# Patient Record
Sex: Female | Born: 1970 | Race: White | Hispanic: No | Smoking: Never smoker
Health system: Southern US, Community
[De-identification: ages and names within clinical notes are randomized; demographics above are authoritative.]

## PROBLEM LIST (undated history)

## (undated) DIAGNOSIS — R238 Other skin changes: Secondary | ICD-10-CM

## (undated) DIAGNOSIS — R519 Headache, unspecified: Secondary | ICD-10-CM

## (undated) DIAGNOSIS — M1990 Unspecified osteoarthritis, unspecified site: Secondary | ICD-10-CM

## (undated) DIAGNOSIS — R531 Weakness: Secondary | ICD-10-CM

## (undated) DIAGNOSIS — R51 Headache: Secondary | ICD-10-CM

## (undated) DIAGNOSIS — K829 Disease of gallbladder, unspecified: Secondary | ICD-10-CM

## (undated) DIAGNOSIS — IMO0002 Reserved for concepts with insufficient information to code with codable children: Secondary | ICD-10-CM

## (undated) DIAGNOSIS — R112 Nausea with vomiting, unspecified: Secondary | ICD-10-CM

## (undated) DIAGNOSIS — E7800 Pure hypercholesterolemia, unspecified: Secondary | ICD-10-CM

## (undated) DIAGNOSIS — K219 Gastro-esophageal reflux disease without esophagitis: Secondary | ICD-10-CM

## (undated) DIAGNOSIS — G43909 Migraine, unspecified, not intractable, without status migrainosus: Secondary | ICD-10-CM

## (undated) DIAGNOSIS — N63 Unspecified lump in unspecified breast: Secondary | ICD-10-CM

## (undated) DIAGNOSIS — R233 Spontaneous ecchymoses: Secondary | ICD-10-CM

## (undated) DIAGNOSIS — R110 Nausea: Secondary | ICD-10-CM

## (undated) DIAGNOSIS — R140 Abdominal distension (gaseous): Secondary | ICD-10-CM

## (undated) DIAGNOSIS — Z9889 Other specified postprocedural states: Secondary | ICD-10-CM

## (undated) HISTORY — DX: Other skin changes: R23.8

## (undated) HISTORY — DX: Migraine, unspecified, not intractable, without status migrainosus: G43.909

## (undated) HISTORY — DX: Weakness: R53.1

## (undated) HISTORY — DX: Nausea: R11.0

## (undated) HISTORY — DX: Abdominal distension (gaseous): R14.0

## (undated) HISTORY — DX: Spontaneous ecchymoses: R23.3

## (undated) HISTORY — DX: Unspecified osteoarthritis, unspecified site: M19.90

## (undated) HISTORY — DX: Headache: R51

## (undated) HISTORY — DX: Unspecified lump in unspecified breast: N63.0

## (undated) HISTORY — DX: Headache, unspecified: R51.9

---
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
## 1997-12-29 ENCOUNTER — Other Ambulatory Visit: Admission: RE | Admit: 1997-12-29 | Discharge: 1997-12-29 | Payer: Self-pay | Admitting: Obstetrics and Gynecology

## 2000-12-09 ENCOUNTER — Inpatient Hospital Stay (HOSPITAL_COMMUNITY): Admission: AD | Admit: 2000-12-09 | Discharge: 2000-12-09 | Payer: Self-pay | Admitting: Obstetrics and Gynecology

## 2000-12-10 ENCOUNTER — Inpatient Hospital Stay (HOSPITAL_COMMUNITY): Admission: AD | Admit: 2000-12-10 | Discharge: 2000-12-10 | Payer: Self-pay | Admitting: Obstetrics and Gynecology

## 2001-01-01 ENCOUNTER — Encounter (INDEPENDENT_AMBULATORY_CARE_PROVIDER_SITE_OTHER): Payer: Self-pay

## 2001-01-01 ENCOUNTER — Inpatient Hospital Stay (HOSPITAL_COMMUNITY): Admission: AD | Admit: 2001-01-01 | Discharge: 2001-01-03 | Payer: Self-pay | Admitting: Obstetrics and Gynecology

## 2001-01-04 ENCOUNTER — Encounter: Admission: RE | Admit: 2001-01-04 | Discharge: 2001-02-03 | Payer: Self-pay | Admitting: Obstetrics and Gynecology

## 2001-01-10 ENCOUNTER — Encounter: Admission: RE | Admit: 2001-01-10 | Discharge: 2001-01-10 | Payer: Self-pay | Admitting: Obstetrics and Gynecology

## 2001-01-10 ENCOUNTER — Encounter: Payer: Self-pay | Admitting: Obstetrics and Gynecology

## 2001-03-06 ENCOUNTER — Encounter: Admission: RE | Admit: 2001-03-06 | Discharge: 2001-04-05 | Payer: Self-pay | Admitting: Obstetrics and Gynecology

## 2001-04-06 ENCOUNTER — Encounter: Admission: RE | Admit: 2001-04-06 | Discharge: 2001-05-06 | Payer: Self-pay | Admitting: Obstetrics and Gynecology

## 2001-06-06 ENCOUNTER — Encounter: Admission: RE | Admit: 2001-06-06 | Discharge: 2001-07-06 | Payer: Self-pay | Admitting: Obstetrics and Gynecology

## 2001-08-06 ENCOUNTER — Encounter: Admission: RE | Admit: 2001-08-06 | Discharge: 2001-09-05 | Payer: Self-pay | Admitting: Obstetrics and Gynecology

## 2001-09-06 ENCOUNTER — Encounter: Admission: RE | Admit: 2001-09-06 | Discharge: 2001-10-06 | Payer: Self-pay | Admitting: Obstetrics and Gynecology

## 2001-11-04 ENCOUNTER — Encounter: Admission: RE | Admit: 2001-11-04 | Discharge: 2001-12-04 | Payer: Self-pay | Admitting: Obstetrics and Gynecology

## 2002-05-16 ENCOUNTER — Emergency Department (HOSPITAL_COMMUNITY): Admission: EM | Admit: 2002-05-16 | Discharge: 2002-05-17 | Payer: Self-pay | Admitting: Emergency Medicine

## 2002-05-17 ENCOUNTER — Encounter: Payer: Self-pay | Admitting: Emergency Medicine

## 2002-07-21 ENCOUNTER — Other Ambulatory Visit: Admission: RE | Admit: 2002-07-21 | Discharge: 2002-07-21 | Payer: Self-pay | Admitting: Obstetrics and Gynecology

## 2002-09-03 ENCOUNTER — Ambulatory Visit (HOSPITAL_COMMUNITY): Admission: RE | Admit: 2002-09-03 | Discharge: 2002-09-03 | Payer: Self-pay | Admitting: Obstetrics and Gynecology

## 2004-02-29 ENCOUNTER — Other Ambulatory Visit: Admission: RE | Admit: 2004-02-29 | Discharge: 2004-02-29 | Payer: Self-pay | Admitting: Obstetrics and Gynecology

## 2004-08-25 ENCOUNTER — Encounter (INDEPENDENT_AMBULATORY_CARE_PROVIDER_SITE_OTHER): Payer: Self-pay | Admitting: *Deleted

## 2004-08-25 ENCOUNTER — Ambulatory Visit (HOSPITAL_COMMUNITY): Admission: RE | Admit: 2004-08-25 | Discharge: 2004-08-25 | Payer: Self-pay | Admitting: Obstetrics and Gynecology

## 2007-08-07 HISTORY — PX: ABDOMINAL HYSTERECTOMY: SHX81

## 2007-08-29 ENCOUNTER — Ambulatory Visit (HOSPITAL_COMMUNITY): Admission: RE | Admit: 2007-08-29 | Discharge: 2007-08-29 | Payer: Self-pay | Admitting: Internal Medicine

## 2007-08-29 ENCOUNTER — Ambulatory Visit: Payer: Self-pay | Admitting: Vascular Surgery

## 2009-09-17 ENCOUNTER — Emergency Department (HOSPITAL_COMMUNITY): Admission: EM | Admit: 2009-09-17 | Discharge: 2009-09-17 | Payer: Self-pay | Admitting: Family Medicine

## 2010-10-26 LAB — POCT URINALYSIS DIP (DEVICE)
Bilirubin Urine: NEGATIVE
Glucose, UA: NEGATIVE mg/dL
Hgb urine dipstick: NEGATIVE
Ketones, ur: NEGATIVE mg/dL
Nitrite: NEGATIVE
Protein, ur: NEGATIVE mg/dL
Specific Gravity, Urine: 1.02 (ref 1.005–1.030)
Urobilinogen, UA: 0.2 mg/dL (ref 0.0–1.0)
pH: 5 (ref 5.0–8.0)

## 2010-10-26 LAB — POCT PREGNANCY, URINE: Preg Test, Ur: NEGATIVE

## 2010-12-22 NOTE — Op Note (Signed)
NAMEMERRIT, WAUGH              ACCOUNT NO.:  [id number]   MEDICAL RECORD NO.:  [id number]          PATIENT TYPE:  AMB   LOCATION:  SDC                           FACILITY:  [facility]   PHYSICIAN:  Dineen Kid. Rana Snare, M.D.    DATE OF BIRTH:  09/15/1970   DATE OF PROCEDURE:  08/25/2004  DATE OF DISCHARGE:                                 OPERATIVE REPORT   PREOPERATIVE DIAGNOSES:  Abnormal uterine bleeding and endometrial mass on  sonohysterogram.   POSTOPERATIVE DIAGNOSES:  Abnormal uterine bleeding and endometrial mass on  sonohysterogram.  Endometrial polyps.   PROCEDURE:  Hysteroscopy D&C with polypectomy.   SURGEON:  Dineen Kid. Rana Snare, M.D.   ANESTHESIA:  General by LMA and also paracervical block.   INDICATIONS FOR PROCEDURE:  Ms. Kerney Elbe is a 40 year old status post tubal  ligation who presented to me in August with abnormal uterine bleeding not  controlled with conservative measures. She underwent a sonohysterogram  showing two small endometrial polyps on the anterior surface. She continued  to have menometrorrhagia which has not been controlled with birth control  pills, a moderate amount of dysmenorrhea requiring antiinflammatory  medications as well as Vicodin for pain relief. She presents today for  surgical evaluation and removal of these polyps.  Planned hysteroscopy, D&C  with polypectomy.  The risks and benefits were discussed and informed  consent was obtained.   FINDINGS:  Endometrial polyps on the anterior wall otherwise normal  appearing endometrium, normal appearing ostia, normal appearing cervix.   DESCRIPTION OF PROCEDURE:  After adequate analgesia, the patient was placed  in the dorsal lithotomy position, she was sterilely prepped and draped, the  bladder was sterilely drained. A Graves speculum was placed, tenaculum was  placed on the anterior lip of the cervix, paracervical block was placed with  1% Xylocaine 1:100,000 epinephrine, 20 mL total used. The uterus  sounded to  8 cm, easily dilated to a #27 Pratt dilator. The hysteroscope was inserted  and the above findings were noted.  Curettage performed retrieving small  fragments of endometrial polyp on the anterior surface. This was performed  until a gritty surface was felt throughout the endometrial cavity.  The  hysteroscope was reinserted, the polyps were completely removed and no  remaining pathology was identified.  The hysteroscope was then removed,  tenaculum removed from the cervix, the cervix found to be hemostatic. The  patient was then transferred to the recovery room in stable condition.  She  received Toradol 30 mg IV.  Sorbitol deficit was 30 mL.   DISPOSITION:  The patient will be discharged home and will followup in the  office in 2-3 weeks.  She was sent home with a routine instruction sheet for  D&C and told to return for increased pain, fever or bleeding.      DCL/MEDQ  D:  08/25/2004  T:  08/25/2004  Job:  [id number]

## 2010-12-22 NOTE — H&P (Signed)
NAME:  Kathleen Carson, Kathleen Carson                        ACCOUNT NO.:  [id number]   MEDICAL RECORD NO.:  [id number]                   PATIENT TYPE:  AMB   LOCATION:  SDC                                  FACILITY:  [facility]   PHYSICIAN:  Dineen Kid. Rana Snare, M.D.                 DATE OF BIRTH:  1971/05/21   DATE OF ADMISSION:  09/03/2002  DATE OF DISCHARGE:                                HISTORY & PHYSICAL   HISTORY OF PRESENT ILLNESS:  The patient is a 40 year old G5 P4 A1 with  desire of sterility.  She also has been having problems with bilateral  pelvic pain, dyspareunia with a predilection towards the left side, and also  generalized dysmenorrhea.  She has been on oral contraceptive agents without  improvement of the symptoms and also on antiinflammatory medications.  She  desires definitive surgical evaluation and treatment for this.  She also  presents for laparoscopic bilateral tubal ligation.   PAST MEDICAL HISTORY:  Negative.   SOCIAL HISTORY:  She had a D&C in 1989, tonsillectomy at age [age], hernia  repair on the right side at age [age].  She has had three vaginal deliveries  and one miscarriage.   MEDICATIONS:  Currently on Alesse.   ALLERGIES:  CODEINE.   PHYSICAL EXAMINATION:  VITAL SIGNS:  Blood pressure 118/62.  HEART:  Regular rate and rhythm.  LUNGS:  Clear to auscultation bilaterally.  ABDOMEN:  Nondistended, nontender.  PELVIC:  Uterus is anteverted, mobile, nontender, with no adnexal masses  palpable.  No uterosacral nodularity is noted.   IMPRESSION AND PLAN:  1. Desired sterility.  The patient desires bilateral tubal ligation.  Risks     and benefits are discussed at length including 12/998 failure rate.  She     does give her informed consent.  2. Dysmenorrhea, pelvic pain, and dyspareunia.  Plan laparoscopic evaluation     of the pelvis for possible endometriosis, adhesions, or causes of the     pelvic pain; possible ablation of endometriosis or lysis of adhesions.    Risks and benefits again were discussed at length.  Informed consent was     obtained which includes but not limited to risk of infection; bleeding;     damage to uterus, tubes, ovaries, bowel, or bladder; possibility of not     being able to alleviate the pelvic pain or recurrence of the pelvic pain;     also risks associated with general anesthesia and also blood transfusion.     She gives her informed consent.                                               Dineen Kid Rana Snare, M.D.    DCL/MEDQ  D:  09/02/2002  T:  09/02/2002  Job:  [id number]  

## 2010-12-22 NOTE — Op Note (Signed)
NAME:  QUANTIA, GRULLON                        ACCOUNT NO.:  [id number]   MEDICAL RECORD NO.:  [id number]                   PATIENT TYPE:  AMB   LOCATION:  SDC                                  FACILITY:  [facility]   PHYSICIAN:  Dineen Kid. Rana Snare, M.D.                 DATE OF BIRTH:  11/10/1970   DATE OF PROCEDURE:  09/03/2002  DATE OF DISCHARGE:                                 OPERATIVE REPORT   PREOPERATIVE DIAGNOSES:  1. Dysmenorrhea.  2. Pelvic pain.  3. Dyspareunia.  4. Desired sterility.   POSTOPERATIVE DIAGNOSES:  1. Dysmenorrhea.  2. Pelvic pain.  3. Dyspareunia.  4. Desired sterility.  5. Pelvic congestion syndrome.   PROCEDURE:  Laparoscopy with bilateral tubal ligation by fulguration.   SURGEON:  Dineen Kid. Rana Snare, M.D.   ANESTHESIA:  General endotracheal.   INDICATIONS:  The patient is a 40 year old G5, P4, A1, with desired  sterility.  She has been having a lot of problems with bilateral pelvic  pain, dyspareunia, and generalized dysmenorrhea.  She has been on oral  contraceptive agents and anti-inflammatory medications without much benefit.  She desires definitive surgical evaluation and treatment for this and also  bilateral tubal ligation.  The risks and benefits were discussed at length,  including five out of 1000 tubal failure.  She does give her informed  consent.  See history and physical for further details.   FINDINGS:  At the time of surgery, the appendix was retrocecal but a normal-  appearing liver.  The uterus, tubes, and ovaries were grossly normal in  appearance; however, from the broad ligament down to the uterosacral  ligaments there were markedly enlarged veins consistent with pelvic  congestion syndrome.  No evidence of endometriosis was seen, and the cul-de-  sac was also clear.   DESCRIPTION OF PROCEDURE:  After adequate analgesia, the patient was placed  in the dorsal lithotomy position.  She was sterilely prepped and draped.  The bladder was  sterilely drained.  A Hulka tenaculum was placed into the  cervix.  A 1 cm infraumbilical skin incision was made, a Veress needle was  inserted, the abdomen was insufflated with dullness to percussion.  A 5 mm  trocar was inserted to the left of the midline two fingerbreadths above the  pubic symphysis.  The above findings were then noted.  After careful  examination of the abdomen and pelvis revealing only signs of pelvic  congestion syndrome and no identifiable adhesions or endometriosis, tubal  ligation was carried out by identifying the right fallopian tube.  The  midportion of the tube was grasped, was cauterized with bipolar  cauterization over an approximately 3 cm section of the tube with loss of  resistance by the Ohm meter and a good thermal burn noted throughout the  entirety of the tube.  The left fallopian tube was identified in a similar  fashion.  The midportion  of the tube was grasped, cauterized over a 3 cm  section of the tube with loss of resistance by the Ohm meter and a good  thermal burn noted across the entirety of the tube.  At this time the  abdomen was desufflated, the trocars were removed.  The infraumbilical skin  incision was closed with 0 Vicryl in the fascia, 3-0 Vicryl Rapide  subcuticular stitch on the skin.  The 5 mm trocar site was closed with 3-0  Vicryl Rapide subcuticular stitch.  The incisions were injected with 0.25%  Marcaine, 10 mL used, and the tenaculum was removed from the cervix and  noted to be hemostatic.  The patient tolerated the procedure well, was  stable on transfer to the recovery room.  The sponge and instrument count  was normal x3.  Estimated blood loss less than 10 mL.   DISPOSITION:  The patient will be discharged home and will follow up in the  office in two to three weeks.  Sent home with a routine instruction sheet  for laparoscopy.  She already has a prescription for Vicodin and ibuprofen  at home.  Told to return for increased  fever, pain, or bleeding.                                               Dineen Kid Rana Snare, M.D.    DCL/MEDQ  D:  09/03/2002  T:  09/03/2002  Job:  [id number]

## 2011-03-14 ENCOUNTER — Other Ambulatory Visit: Payer: Self-pay | Admitting: Family Medicine

## 2011-03-14 DIAGNOSIS — N6320 Unspecified lump in the left breast, unspecified quadrant: Secondary | ICD-10-CM

## 2011-03-15 ENCOUNTER — Inpatient Hospital Stay (INDEPENDENT_AMBULATORY_CARE_PROVIDER_SITE_OTHER)
Admission: RE | Admit: 2011-03-15 | Discharge: 2011-03-15 | Disposition: A | Discharge status: Home / Self Care | Payer: Self-pay | Source: Ambulatory Visit | Attending: Emergency Medicine | Admitting: Emergency Medicine

## 2011-03-15 DIAGNOSIS — J019 Acute sinusitis, unspecified: Secondary | ICD-10-CM

## 2011-03-15 DIAGNOSIS — R1032 Left lower quadrant pain: Secondary | ICD-10-CM

## 2011-03-15 DIAGNOSIS — H9209 Otalgia, unspecified ear: Secondary | ICD-10-CM

## 2011-03-20 ENCOUNTER — Ambulatory Visit
Admission: RE | Admit: 2011-03-20 | Discharge: 2011-03-20 | Disposition: A | Discharge status: Home / Self Care | Payer: Self-pay | Source: Ambulatory Visit | Attending: Family Medicine | Admitting: Family Medicine

## 2011-03-20 ENCOUNTER — Other Ambulatory Visit: Payer: Self-pay | Admitting: Family Medicine

## 2011-03-20 DIAGNOSIS — Z1231 Encounter for screening mammogram for malignant neoplasm of breast: Secondary | ICD-10-CM

## 2011-03-20 DIAGNOSIS — N6320 Unspecified lump in the left breast, unspecified quadrant: Secondary | ICD-10-CM

## 2011-03-22 ENCOUNTER — Other Ambulatory Visit: Payer: Self-pay | Admitting: Family Medicine

## 2011-03-22 DIAGNOSIS — R928 Other abnormal and inconclusive findings on diagnostic imaging of breast: Secondary | ICD-10-CM

## 2011-03-29 ENCOUNTER — Ambulatory Visit
Admission: RE | Admit: 2011-03-29 | Discharge: 2011-03-29 | Disposition: A | Discharge status: Home / Self Care | Payer: Self-pay | Source: Ambulatory Visit | Attending: Family Medicine | Admitting: Family Medicine

## 2011-03-29 DIAGNOSIS — R928 Other abnormal and inconclusive findings on diagnostic imaging of breast: Secondary | ICD-10-CM

## 2011-06-25 ENCOUNTER — Encounter: Payer: Self-pay | Admitting: *Deleted

## 2011-06-25 ENCOUNTER — Other Ambulatory Visit: Payer: Self-pay

## 2011-06-25 ENCOUNTER — Emergency Department (HOSPITAL_COMMUNITY)
Admission: EM | Admit: 2011-06-25 | Discharge: 2011-06-25 | Discharge status: Against Medical Advice | Payer: Medicaid Other | Attending: Emergency Medicine | Admitting: Emergency Medicine

## 2011-06-25 DIAGNOSIS — R1013 Epigastric pain: Secondary | ICD-10-CM | POA: Insufficient documentation

## 2011-06-25 NOTE — ED Notes (Signed)
The pt has epigastric pain since Sunday night intermittently.  She describes it as a pressure and getting worse.

## 2011-07-21 ENCOUNTER — Emergency Department (HOSPITAL_COMMUNITY)
Admission: EM | Admit: 2011-07-21 | Discharge: 2011-07-21 | Disposition: A | Discharge status: Home / Self Care | Payer: Medicaid Other | Attending: Emergency Medicine | Admitting: Emergency Medicine

## 2011-07-21 ENCOUNTER — Encounter (HOSPITAL_COMMUNITY): Payer: Self-pay | Admitting: *Deleted

## 2011-07-21 ENCOUNTER — Emergency Department (HOSPITAL_COMMUNITY): Payer: Medicaid Other

## 2011-07-21 DIAGNOSIS — K819 Cholecystitis, unspecified: Secondary | ICD-10-CM | POA: Insufficient documentation

## 2011-07-21 DIAGNOSIS — R10819 Abdominal tenderness, unspecified site: Secondary | ICD-10-CM | POA: Insufficient documentation

## 2011-07-21 DIAGNOSIS — K7689 Other specified diseases of liver: Secondary | ICD-10-CM | POA: Insufficient documentation

## 2011-07-21 DIAGNOSIS — R109 Unspecified abdominal pain: Secondary | ICD-10-CM | POA: Insufficient documentation

## 2011-07-21 HISTORY — DX: Reserved for concepts with insufficient information to code with codable children: IMO0002

## 2011-07-21 LAB — CBC
HCT: 41.8 % (ref 36.0–46.0)
Hemoglobin: 14.4 g/dL (ref 12.0–15.0)
RBC: 4.65 MIL/uL (ref 3.87–5.11)
WBC: 6.2 10*3/uL (ref 4.0–10.5)

## 2011-07-21 LAB — DIFFERENTIAL
Lymphocytes Relative: 27 % (ref 12–46)
Lymphs Abs: 1.7 10*3/uL (ref 0.7–4.0)
Monocytes Absolute: 0.6 10*3/uL (ref 0.1–1.0)
Monocytes Relative: 10 % (ref 3–12)
Neutro Abs: 3.8 10*3/uL (ref 1.7–7.7)

## 2011-07-21 LAB — COMPREHENSIVE METABOLIC PANEL
Alkaline Phosphatase: 276 U/L — ABNORMAL HIGH (ref 39–117)
BUN: 10 mg/dL (ref 6–23)
CO2: 24 mEq/L (ref 19–32)
Chloride: 102 mEq/L (ref 96–112)
Creatinine, Ser: 0.88 mg/dL (ref 0.50–1.10)
GFR calc non Af Amer: 81 mL/min — ABNORMAL LOW (ref >90)
Total Bilirubin: 0.8 mg/dL (ref 0.3–1.2)

## 2011-07-21 LAB — LIPASE, BLOOD: Lipase: 21 U/L (ref 11–59)

## 2011-07-21 MED ORDER — SODIUM CHLORIDE 0.9 % IV SOLN
INTRAVENOUS | Status: DC
  Administered 2011-07-21: 09:00:00 via INTRAVENOUS

## 2011-07-21 MED ORDER — ONDANSETRON 8 MG PO TBDP: 8.0000 mg | ORAL_TABLET | Freq: Three times a day (TID) | ORAL | Status: AC | PRN

## 2011-07-21 MED ORDER — FENTANYL CITRATE 0.05 MG/ML IJ SOLN
50.0000 ug | Freq: Once | INTRAMUSCULAR | Status: AC
  Administered 2011-07-21: 50 ug via INTRAVENOUS
  Filled 2011-07-21: qty 2

## 2011-07-21 MED ORDER — OXYCODONE-ACETAMINOPHEN 5-325 MG PO TABS: 1.0000 | ORAL_TABLET | Freq: Four times a day (QID) | ORAL | Status: AC | PRN

## 2011-07-21 MED ORDER — ONDANSETRON HCL 4 MG/2ML IJ SOLN
4.0000 mg | Freq: Once | INTRAMUSCULAR | Status: AC
  Administered 2011-07-21: 4 mg via INTRAVENOUS
  Filled 2011-07-21: qty 2

## 2011-07-21 NOTE — ED Provider Notes (Signed)
History     CSN: [id number] Arrival date & time: 07/21/2011  8:21 AM   First MD Initiated Contact with Patient 07/21/11 [phone]      Chief Complaint  Patient presents with  . Abdominal Pain    epigastric    (Consider location/radiation/quality/duration/timing/severity/associated sxs/prior treatment) Patient is a 40 y.o. female presenting with abdominal pain. The history is provided by the patient.  Abdominal Pain The primary symptoms of the illness include abdominal pain and nausea. The primary symptoms of the illness do not include vomiting.  Symptoms associated with the illness do not include chills or back pain.  patient has had episodes of epigastric abdominal pain for the last month. It is worse after eating. She woke with the severe pain this morning. His been constant. She's had nauseousness with the episode. No vomiting. No diarrhea. She's had a previous hysterectomy. Nothing makes the pain better. No fevers.  Past Medical History  Diagnosis Date  . Ulcer     Past Surgical History  Procedure Date  . Tonsillectomy   . Abdominal hysterectomy     No family history on file.  History  Substance Use Topics  . Smoking status: Never Smoker   . Smokeless tobacco: Not on file  . Alcohol Use: No    OB History    Grav Para Term Preterm Abortions TAB SAB Ect Mult Living                  Review of Systems  Constitutional: Negative for chills.  Respiratory: Negative for chest tightness.   Cardiovascular: Negative for chest pain.  Gastrointestinal: Positive for nausea and abdominal pain. Negative for vomiting.  Genitourinary: Negative for pelvic pain.  Musculoskeletal: Negative for back pain.  Neurological: Negative for headaches.  Psychiatric/Behavioral: Negative for confusion.    Allergies  Codeine  Home Medications   Current Outpatient Rx  Name Route Sig Dispense Refill  . ESTRADIOL 2 MG PO TABS Oral Take 2 mg by mouth daily.      Marland Kitchen FLUOXETINE HCL 20 MG PO  CAPS Oral Take 20 mg by mouth daily.      Marland Kitchen PROGESTERONE MICRONIZED 100 MG PO CAPS Oral Take 100 mg by mouth daily.      Marland Kitchen ONDANSETRON 8 MG PO TBDP Oral Take 1 tablet (8 mg total) by mouth every 8 (eight) hours as needed for nausea. 20 tablet 0  . OXYCODONE-ACETAMINOPHEN 5-325 MG PO TABS Oral Take 1-2 tablets by mouth every 6 (six) hours as needed for pain. 20 tablet 0    BP 122/86  Pulse 90  Temp(Src) 98 F (36.7 C) (Oral)  Resp 18  SpO2 96%  Physical Exam  Nursing note and vitals reviewed. Constitutional: She is oriented to person, place, and time. She appears well-developed and well-nourished.  HENT:  Head: Normocephalic and atraumatic.  Eyes: EOM are normal. Pupils are equal, round, and reactive to light.  Neck: Normal range of motion. Neck supple.  Cardiovascular: Normal rate, regular rhythm and normal heart sounds.   No murmur heard. Pulmonary/Chest: Effort normal and breath sounds normal. No respiratory distress. She has no wheezes. She has no rales.  Abdominal: Soft. Bowel sounds are normal. She exhibits no distension. There is tenderness. There is no rebound and no guarding.       Moderate tenderness to epigastric and right upper quadrant.  Musculoskeletal: Normal range of motion.  Neurological: She is alert and oriented to person, place, and time. No cranial nerve deficit.  Skin: Skin  is warm and dry.  Psychiatric: She has a normal mood and affect. Her speech is normal.    ED Course  Procedures (including critical care time)  Labs Reviewed  COMPREHENSIVE METABOLIC PANEL - Abnormal; Notable for the following:    Sodium 134 (*)    Glucose, Bld 103 (*)    AST 500 (*)    ALT 312 (*)    Alkaline Phosphatase 276 (*)    GFR calc non Af Amer 81 (*)    All other components within normal limits  CBC  DIFFERENTIAL  LIPASE, BLOOD   US Abdomen Complete  07/21/2011  *RADIOLOGY REPORT*  Clinical Data:  Epigastric pain.  ABDOMINAL ULTRASOUND COMPLETE  Comparison:  None.   Findings:  Gallbladder:  Multiple small shadowing calculi are identified within the dependent portion of the gallbladder.  The gallbladder wall is increased in thickness measuring 4.7 mm.  Positive sonographic Murphy's sign.  Common Bile Duct:  Within normal limits in caliber.  Liver: The liver parenchyma has a slightly coarsened echotexture. There is no mass identified.  IVC:  Appears normal.  Pancreas:  No abnormality identified.  Spleen:  Within normal limits in size and echotexture.  Right kidney:  Normal in size and parenchymal echogenicity.  No evidence of mass or hydronephrosis.  Left kidney:  Normal in size and parenchymal echogenicity.  No evidence of mass or hydronephrosis.  Abdominal Aorta:  No aneurysm identified.  IMPRESSION:  1.  Gallstones and gallbladder wall thickening.  There is also a positive sonographic Murphy's sign.  Cannot rule out acute cholecystitis. 2.  Fatty infiltration of the liver.  Original Report Authenticated By: Rosealee Albee, M.D.     1. Cholecystitis       MDM  Patient with right upper quadrant abdominal pain. Moderate severity. LFTs are somewhat elevated. The liver limits not elevated. Her white count is not elevated. She's not had fevers. Her ultrasound showed cholecystitis. Patient is not wanted the surgery done today if possible. Discussed the case with Dr. Daphine Deutscher  From general surgery. He recommended pain medicines and clear liquid diet. He said without cholangitis should not harm her to followup for an outpatient surgery. He states to call the office on Monday. She will be discharged.        Juliet Rude. Rubin Payor, MD 07/21/11 1120

## 2011-07-21 NOTE — ED Notes (Signed)
Patient given discharge instructions, information, prescriptions, and diet order. Patient states that they adequately understand discharge information given and to return to ED if symptoms return or worsen.     

## 2011-07-21 NOTE — ED Notes (Signed)
Epigastric pain worsening over last few weeks, more frequent episodes, some nausea

## 2011-07-29 ENCOUNTER — Emergency Department (HOSPITAL_COMMUNITY)
Admission: EM | Admit: 2011-07-29 | Discharge: 2011-07-29 | Disposition: A | Discharge status: Home / Self Care | Payer: Medicaid Other | Attending: Emergency Medicine | Admitting: Emergency Medicine

## 2011-07-29 ENCOUNTER — Encounter (HOSPITAL_COMMUNITY): Payer: Self-pay | Admitting: *Deleted

## 2011-07-29 DIAGNOSIS — K819 Cholecystitis, unspecified: Secondary | ICD-10-CM

## 2011-07-29 HISTORY — DX: Disease of gallbladder, unspecified: K82.9

## 2011-07-29 LAB — DIFFERENTIAL
Basophils Absolute: 0 10*3/uL (ref 0.0–0.1)
Basophils Relative: 0 % (ref 0–1)
Eosinophils Relative: 2 % (ref 0–5)
Lymphocytes Relative: 30 % (ref 12–46)
Neutro Abs: 4.7 10*3/uL (ref 1.7–7.7)

## 2011-07-29 LAB — URINALYSIS, ROUTINE W REFLEX MICROSCOPIC
Glucose, UA: NEGATIVE mg/dL
Hgb urine dipstick: NEGATIVE
Leukocytes, UA: NEGATIVE
Specific Gravity, Urine: 1.044 — ABNORMAL HIGH (ref 1.005–1.030)
pH: 5.5 (ref 5.0–8.0)

## 2011-07-29 LAB — CBC
MCHC: 35.1 g/dL (ref 30.0–36.0)
MCV: 88.9 fL (ref 78.0–100.0)
Platelets: 277 10*3/uL (ref 150–400)
RDW: 11.9 % (ref 11.5–15.5)
WBC: 8.3 10*3/uL (ref 4.0–10.5)

## 2011-07-29 LAB — POCT I-STAT, CHEM 8
BUN: 20 mg/dL (ref 6–23)
Chloride: 105 mEq/L (ref 96–112)
Creatinine, Ser: 1.1 mg/dL (ref 0.50–1.10)
Potassium: 4.2 mEq/L (ref 3.5–5.1)
Sodium: 140 mEq/L (ref 135–145)

## 2011-07-29 LAB — COMPREHENSIVE METABOLIC PANEL
ALT: 78 U/L — ABNORMAL HIGH (ref 0–35)
AST: 68 U/L — ABNORMAL HIGH (ref 0–37)
Albumin: 3.6 g/dL (ref 3.5–5.2)
CO2: 27 mEq/L (ref 19–32)
Calcium: 9.2 mg/dL (ref 8.4–10.5)
Creatinine, Ser: 0.99 mg/dL (ref 0.50–1.10)
GFR calc non Af Amer: 70 mL/min — ABNORMAL LOW (ref >90)
Sodium: 137 mEq/L (ref 135–145)

## 2011-07-29 MED ORDER — SODIUM CHLORIDE 0.9 % IV SOLN: 20.0000 mL | INTRAVENOUS | Status: DC

## 2011-07-29 MED ORDER — FENTANYL CITRATE 0.05 MG/ML IJ SOLN
50.0000 ug | Freq: Once | INTRAMUSCULAR | Status: DC
  Filled 2011-07-29: qty 2

## 2011-07-29 MED ORDER — ONDANSETRON HCL 4 MG/2ML IJ SOLN
4.0000 mg | Freq: Once | INTRAMUSCULAR | Status: DC
  Filled 2011-07-29: qty 2

## 2011-07-29 NOTE — ED Notes (Signed)
Pa notified of elopement

## 2011-07-29 NOTE — ED Notes (Signed)
Pt c/o epigastric pain radiating to chest and back. Pt recently dx'd w/ cholecystitis. Pt c/o nausea, denies vomiting. Pt last took pain meds at 2330. Last nausea med at 2230.

## 2011-07-29 NOTE — ED Notes (Signed)
Pa seen and reassesed pt, felt refused pain medication and verbalized that she's feeling better and ready for discharge as per pa, pt refused to wait for discharge paper, walked out with spouse

## 2011-07-31 NOTE — ED Provider Notes (Signed)
History     CSN: [id number]  Arrival date & time 07/29/11  0133   First MD Initiated Contact with Patient 07/29/11 [phone]      Chief Complaint  Patient presents with  . Abdominal Pain    recently diagnosed w/ gallstones    (Consider location/radiation/quality/duration/timing/severity/associated sxs/prior treatment) HPI Comments: Patient presents today with RUQ and epigastric pain.  She was seen in the ED for similar symptoms 7 days ago and diagnosed with Acute Cholecystitis.  At that time she opted to not have surgery and wait to have surgery with her surgeon the first week of January.  She reports that since arriving in the ED her pain has improved significantly.  She denies vomiting and denies a fever.  She reports that she has been eating low fat foods.    Patient is a 40 y.o. female presenting with abdominal pain. The history is provided by the patient and the spouse.  Abdominal Pain The primary symptoms of the illness include abdominal pain and nausea. The primary symptoms of the illness do not include fever, fatigue, shortness of breath, vomiting, diarrhea, hematemesis, hematochezia or dysuria. The problem has been gradually worsening.  The patient states that she believes she is currently not pregnant. The patient has not had a change in bowel habit. Symptoms associated with the illness do not include chills, diaphoresis, heartburn, constipation, urgency, hematuria, frequency or back pain. Significant associated medical issues include gallstones.    Past Medical History  Diagnosis Date  . Ulcer   . Gallbladder attack     Past Surgical History  Procedure Date  . Tonsillectomy   . Abdominal hysterectomy   . Hernia repair     History reviewed. No pertinent family history.  History  Substance Use Topics  . Smoking status: Never Smoker   . Smokeless tobacco: Not on file  . Alcohol Use: No    OB History    Grav Para Term Preterm Abortions TAB SAB Ect Mult Living             Review of Systems  Constitutional: Negative for fever, chills, diaphoresis and fatigue.  Respiratory: Negative for chest tightness, shortness of breath and wheezing.   Cardiovascular: Negative for chest pain.  Gastrointestinal: Positive for nausea and abdominal pain. Negative for heartburn, vomiting, diarrhea, constipation, blood in stool, hematochezia and hematemesis.  Genitourinary: Negative for dysuria, urgency, frequency and hematuria.  Musculoskeletal: Negative for back pain.  Neurological: Negative for dizziness, syncope and light-headedness.    Allergies  Codeine  Home Medications   Current Outpatient Rx  Name Route Sig Dispense Refill  . ESTRADIOL 2 MG PO TABS Oral Take 2 mg by mouth daily.      Marland Kitchen FLUOXETINE HCL 20 MG PO CAPS Oral Take 20 mg by mouth daily.      . OXYCODONE-ACETAMINOPHEN 5-325 MG PO TABS Oral Take 1-2 tablets by mouth every 6 (six) hours as needed for pain. 20 tablet 0  . PROGESTERONE MICRONIZED 100 MG PO CAPS Oral Take 100 mg by mouth daily.        BP 131/92  Pulse 89  Temp(Src) 97.8 F (36.6 C) (Oral)  Resp 18  SpO2 100%  Physical Exam  Nursing note and vitals reviewed. Constitutional: She is oriented to person, place, and time. She appears well-developed and well-nourished. No distress.  HENT:  Head: Normocephalic and atraumatic.  Neck: Normal range of motion. Neck supple.  Cardiovascular: Normal rate and normal heart sounds.   Pulmonary/Chest: Effort normal and  breath sounds normal.  Abdominal: Soft. Bowel sounds are normal.  Musculoskeletal: Normal range of motion.  Neurological: She is alert and oriented to person, place, and time.  Skin: Skin is warm and dry. No rash noted. She is not diaphoretic.  Psychiatric: She has a normal mood and affect.    ED Course  Procedures (including critical care time)  Labs Reviewed  COMPREHENSIVE METABOLIC PANEL - Abnormal; Notable for the following:    Glucose, Bld 109 (*)    AST 68 (*)     ALT 78 (*)    Alkaline Phosphatase 161 (*)    GFR calc non Af Amer 70 (*)    GFR calc Af Amer 82 (*)    All other components within normal limits  URINALYSIS, ROUTINE W REFLEX MICROSCOPIC - Abnormal; Notable for the following:    Color, Urine AMBER (*) BIOCHEMICALS MAY BE AFFECTED BY COLOR   APPearance CLOUDY (*)    Specific Gravity, Urine 1.044 (*)    Bilirubin Urine SMALL (*)    All other components within normal limits  POCT I-STAT, CHEM 8 - Abnormal; Notable for the following:    Glucose, Bld 109 (*)    All other components within normal limits  CBC  DIFFERENTIAL  LIPASE, BLOOD  LAB REPORT - SCANNED   No results found.   No diagnosis found.  Discussed results of the labs with patient.  IV pain medication had been ordered for patient, but patient denied.  She reported that the pain had improved on its own.  She also stated that she did not want to meet with the surgeon today and that she would see the surgeon in January as previously scheduled.  Patient then left the ED AMA prior to me discharging her.  MDM  Patient with diagnosis of cholecystitis 7 days ago.  She reports that her pain had improved while in the waiting area of the ED.  Patient is afebrile, not vomiting, and able to tolerate po liquids.  Patient has an appointment scheduled for surgery in July.        Pascal Lux [facility] 08/01/11 1304

## 2011-08-01 NOTE — ED Provider Notes (Signed)
Medical screening examination/treatment/procedure(s) were performed by non-physician practitioner and as supervising physician I was immediately available for consultation/collaboration.  Ethelda Chick, MD 08/01/11 [phone]

## 2011-08-15 ENCOUNTER — Telehealth (INDEPENDENT_AMBULATORY_CARE_PROVIDER_SITE_OTHER): Payer: Self-pay

## 2011-08-15 NOTE — Telephone Encounter (Signed)
Pt called c/o aches all over her body,feeling lithargic. Pt states she is having ruq abd pain also.  Pt concerned it may be her gallbladder and not the flu. Pt directed to the ER to be accessed to r/o inflammed gallbladder vs flu. Pt states she will go to ER now.

## 2011-08-17 ENCOUNTER — Other Ambulatory Visit (INDEPENDENT_AMBULATORY_CARE_PROVIDER_SITE_OTHER): Payer: Self-pay | Admitting: Surgery

## 2011-08-17 ENCOUNTER — Encounter (INDEPENDENT_AMBULATORY_CARE_PROVIDER_SITE_OTHER): Payer: Self-pay | Admitting: Surgery

## 2011-08-17 ENCOUNTER — Ambulatory Visit (INDEPENDENT_AMBULATORY_CARE_PROVIDER_SITE_OTHER): Payer: Medicaid Other | Admitting: Surgery

## 2011-08-17 VITALS — BP 116/68 | HR 68 | Temp 97.8°F | Resp 18 | Ht 65.0 in | Wt 203.4 lb

## 2011-08-17 DIAGNOSIS — K8020 Calculus of gallbladder without cholecystitis without obstruction: Secondary | ICD-10-CM | POA: Insufficient documentation

## 2011-08-17 MED ORDER — ONDANSETRON 4 MG PO TBDP: 4.0000 mg | ORAL_TABLET | Freq: Three times a day (TID) | ORAL | Status: AC | PRN

## 2011-08-17 MED ORDER — OXYCODONE-ACETAMINOPHEN 5-325 MG PO TABS: 1.0000 | ORAL_TABLET | Freq: Four times a day (QID) | ORAL | Status: AC | PRN

## 2011-08-17 NOTE — Patient Instructions (Signed)
Stay on low fat diet until surgery 

## 2011-08-17 NOTE — Progress Notes (Signed)
Chief Complaint:  Recurrent attacks of biliary colic  History of Present Illness:  Kathleen Carson is an 41 y.o. female who presented to ER in Dec with attack of RUQ pain.  The following ultrasound was obtained: 07/21/2011 *RADIOLOGY REPORT* Clinical Data: Epigastric pain. ABDOMINAL ULTRASOUND COMPLETE Comparison: None. Findings: Gallbladder: Multiple small shadowing calculi are identified within the dependent portion of the gallbladder. The gallbladder wall is increased in thickness measuring 4.7 mm. Positive sonographic Murphy's sign. Common Bile Duct: Within normal limits in caliber. Liver: The liver parenchyma has a slightly coarsened echotexture. There is no mass identified. IVC: Appears normal. Pancreas: No abnormality identified. Spleen: Within normal limits in size and echotexture. Right kidney: Normal in size and parenchymal echogenicity. No evidence of mass or hydronephrosis. Left kidney: Normal in size and parenchymal echogenicity. No evidence of mass or hydronephrosis. Abdominal Aorta: No aneurysm identified. IMPRESSION: 1. Gallstones and gallbladder wall thickening. There is also a positive sonographic Murphy's sign. Cannot rule out acute cholecystitis. 2. Fatty infiltration of the liver. Original Report Authenticated By: Rosealee Albee, M.D.   She had elevated LFTs and was asked to return to the office  He mom is an old patient of [facility] and me.    Past Medical History  Diagnosis Date  . Ulcer   . Gallbladder attack   . Anemia   . Arthritis   . Abdominal distension   . Constipation   . Nausea   . Abdominal pain   . Weakness   . Generalized headaches   . Bruises easily   . Migraines   . Breast mass     Past Surgical History  Procedure Date  . Tonsillectomy 1976  . Hernia repair 1974  . Abdominal hysterectomy 2009  . Appendectomy     Current Outpatient Prescriptions  Medication Sig Dispense Refill  . estradiol (ESTRACE) 2 MG tablet Take 2 mg by mouth daily.         Marland Kitchen FLUoxetine (PROZAC) 20 MG capsule Take 20 mg by mouth daily.        . ondansetron (ZOFRAN ODT) 4 MG disintegrating tablet Take 1 tablet (4 mg total) by mouth every 8 (eight) hours as needed for nausea.  20 tablet  0  . oxyCODONE-acetaminophen (ROXICET) 5-325 MG per tablet Take 1 tablet by mouth every 6 (six) hours as needed for pain.  30 tablet  0  . progesterone (PROMETRIUM) 100 MG capsule Take 100 mg by mouth daily.         Codeine Family History  Problem Relation Age of Onset  . Colon polyps Mother   . Cancer Maternal Grandfather     colon  . Cancer Paternal Grandmother     breast   Social History:   reports that she has never smoked. She has never used smokeless tobacco. She reports that she does not drink alcohol or use illicit drugs.   REVIEW OF SYSTEMS - PERTINENT POSITIVES ONLY: Left inguinal pain possible hernia.    Physical Exam:   Blood pressure 116/68, pulse 68, temperature 97.8 F (36.6 C), temperature source Temporal, resp. rate 18, height 5\' 5"  (1.651 m), weight 203 lb 6.4 oz (92.262 kg). Body mass index is 33.85 kg/(m^2).  Gen:  WDWN white female in  NAD  Neurological: Alert and oriented to person, place, and time. Motor and sensory function is grossly intact  Head: Normocephalic and atraumatic.  Eyes: Conjunctivae are normal. Pupils are equal, round, and reactive to light. No scleral icterus.  Neck: Normal  range of motion. Neck supple. No tracheal deviation or thyromegaly present.  Cardiovascular:  SR without murmurs or gallops.  No carotid bruits Respiratory: Effort normal.  No respiratory distress. No chest wall tenderness. Breath sounds normal.  No wheezes, rales or rhonchi.  Abdomen:  Sore in mid abdomen.  History of pain going into back concern for passage of small stones GU: Musculoskeletal: Normal range of motion. Extremities are nontender. No cyanosis, edema or clubbing noted Lymphadenopathy: No cervical, preauricular, postauricular or axillary  adenopathy is present Skin: Skin is warm and dry. No rash noted. No diaphoresis. No erythema. No pallor. Pscyh: Normal mood and affect. Behavior is normal. Judgment and thought content normal.   LABORATORY RESULTS: No results found for this or any previous visit (from the past 48 hour(s)).  RADIOLOGY RESULTS: No results found.  Problem List: Patient Active Problem List  Diagnoses  . Gallstones history of cholecystis Dec 2012    Assessment & Plan: Will give rx for roxicet and zofran in the near term.  Schedule for Lap Chole IOC soon    Matt B. Daphine Deutscher, MD, [facility] [phone] beeper [phone]  08/17/2011 12:00 PM

## 2011-08-20 ENCOUNTER — Encounter (HOSPITAL_COMMUNITY)
Admission: RE | Admit: 2011-08-20 | Discharge: 2011-08-20 | Disposition: A | Discharge status: Home / Self Care | Payer: Medicaid Other | Source: Ambulatory Visit | Attending: Surgery | Admitting: Surgery

## 2011-08-20 ENCOUNTER — Encounter (HOSPITAL_COMMUNITY): Payer: Self-pay | Admitting: Pharmacy Technician

## 2011-08-20 ENCOUNTER — Encounter (HOSPITAL_COMMUNITY): Payer: Self-pay

## 2011-08-20 HISTORY — DX: Gastro-esophageal reflux disease without esophagitis: K21.9

## 2011-08-20 HISTORY — DX: Other specified postprocedural states: R11.2

## 2011-08-20 HISTORY — DX: Other specified postprocedural states: Z98.890

## 2011-08-20 LAB — CBC
HCT: 40.7 % (ref 36.0–46.0)
Hemoglobin: 14.1 g/dL (ref 12.0–15.0)
MCHC: 34.6 g/dL (ref 30.0–36.0)
MCV: 87.5 fL (ref 78.0–100.0)
WBC: 7.7 10*3/uL (ref 4.0–10.5)

## 2011-08-20 LAB — COMPREHENSIVE METABOLIC PANEL
Alkaline Phosphatase: 111 U/L (ref 39–117)
BUN: 11 mg/dL (ref 6–23)
Chloride: 102 mEq/L (ref 96–112)
Creatinine, Ser: 0.92 mg/dL (ref 0.50–1.10)
GFR calc Af Amer: 89 mL/min — ABNORMAL LOW (ref >90)
Glucose, Bld: 88 mg/dL (ref 70–99)
Potassium: 4.2 mEq/L (ref 3.5–5.1)
Total Bilirubin: 0.5 mg/dL (ref 0.3–1.2)

## 2011-08-20 LAB — DIFFERENTIAL
Basophils Absolute: 0 10*3/uL (ref 0.0–0.1)
Basophils Relative: 0 % (ref 0–1)
Eosinophils Absolute: 0.1 10*3/uL (ref 0.0–0.7)
Neutrophils Relative %: 59 % (ref 43–77)

## 2011-08-20 LAB — LIPASE, BLOOD: Lipase: 20 U/L (ref 11–59)

## 2011-08-20 LAB — AMYLASE: Amylase: 37 U/L (ref 0–105)

## 2011-08-20 NOTE — Pre-Procedure Instructions (Signed)
PT DOES NOT NEED CXR OR EKG PER ANESTHESIOLOGIST'S GUIDELINES--PT DID HAVE EKG 06/25/11 WHILE AT [place]--COPY OF REPORT ON THIS CHART.

## 2011-08-20 NOTE — Patient Instructions (Addendum)
20 LALAH DURANGO  08/20/2011   Your procedure is scheduled on:  Tuesday  1/15  AT 3 PM  Report to [facility] at 1 PM.  Call this number if you have problems the morning of surgery: [phone]   Remember:   Do not eat food AFTER MIDNIGHT TONIGHT.  May have clear liquids FROM MIDNIGHT TONIGHT UNTIL 9:00 AM DAY OF SURGERY.  NOTHING TO DRINK AFTER 9:00 AM DAY OF SURGERY.  Clear liquids include soda, tea, black coffee, apple or grape juice, WATER  Take these medicines the morning of surgery with A SIP OF WATER: ESTRACE, FLUOXETINE, ZOFRAN   Do not wear jewelry, make-up or nail polish.  Do not wear lotions, powders, or perfumes.  Do not shave 48 hours prior to surgery.  Do not bring valuables to the hospital.  Contacts, dentures or bridgework may not be worn into surgery.  Leave suitcase in the car. After surgery it may be brought to your room.  For patients admitted to the hospital, checkout time is 11:00 AM the day of discharge.   Patients discharged the day of surgery will not be allowed to drive home.    Special Instructions: CHG Shower Use Special Wash: 1/2 bottle night before surgery and 1/2 bottle morning of surgery.   Please read over the following fact sheets that you were given: MRSA Information  FLEETS ENEMA TONIGHT-PER INSTRUCTIONS DR. MARTIN

## 2011-08-20 NOTE — Pre-Procedure Instructions (Signed)
Pt's chart taken to Short Stay--PCR results pending.

## 2011-08-21 ENCOUNTER — Encounter (HOSPITAL_COMMUNITY): Payer: Self-pay | Admitting: *Deleted

## 2011-08-21 ENCOUNTER — Encounter (HOSPITAL_COMMUNITY): Payer: Self-pay | Admitting: Anesthesiology

## 2011-08-21 ENCOUNTER — Inpatient Hospital Stay (HOSPITAL_COMMUNITY)
Admission: RE | Admit: 2011-08-21 | Discharge: 2011-08-22 | DRG: 419 | Disposition: A | Discharge status: Home / Self Care | Payer: Medicaid Other | Source: Ambulatory Visit | Attending: Surgery | Admitting: Surgery

## 2011-08-21 ENCOUNTER — Ambulatory Visit (HOSPITAL_COMMUNITY): Payer: Medicaid Other

## 2011-08-21 ENCOUNTER — Encounter (HOSPITAL_COMMUNITY)
Admission: RE | Disposition: A | Discharge status: Home / Self Care | Payer: Self-pay | Source: Ambulatory Visit | Attending: Surgery

## 2011-08-21 ENCOUNTER — Ambulatory Visit (HOSPITAL_COMMUNITY): Payer: Medicaid Other | Admitting: Anesthesiology

## 2011-08-21 ENCOUNTER — Other Ambulatory Visit (INDEPENDENT_AMBULATORY_CARE_PROVIDER_SITE_OTHER): Payer: Self-pay | Admitting: Surgery

## 2011-08-21 DIAGNOSIS — K8010 Calculus of gallbladder with chronic cholecystitis without obstruction: Principal | ICD-10-CM | POA: Diagnosis present

## 2011-08-21 DIAGNOSIS — D649 Anemia, unspecified: Secondary | ICD-10-CM | POA: Diagnosis present

## 2011-08-21 DIAGNOSIS — K824 Cholesterolosis of gallbladder: Secondary | ICD-10-CM

## 2011-08-21 DIAGNOSIS — G43909 Migraine, unspecified, not intractable, without status migrainosus: Secondary | ICD-10-CM | POA: Diagnosis present

## 2011-08-21 DIAGNOSIS — K8020 Calculus of gallbladder without cholecystitis without obstruction: Secondary | ICD-10-CM

## 2011-08-21 DIAGNOSIS — K219 Gastro-esophageal reflux disease without esophagitis: Secondary | ICD-10-CM | POA: Diagnosis present

## 2011-08-21 DIAGNOSIS — M129 Arthropathy, unspecified: Secondary | ICD-10-CM | POA: Diagnosis present

## 2011-08-21 DIAGNOSIS — K4090 Unilateral inguinal hernia, without obstruction or gangrene, not specified as recurrent: Secondary | ICD-10-CM | POA: Diagnosis present

## 2011-08-21 HISTORY — PX: CHOLECYSTECTOMY: SHX55

## 2011-08-21 SURGERY — LAPAROSCOPIC CHOLECYSTECTOMY WITH INTRAOPERATIVE CHOLANGIOGRAM
Anesthesia: General | Site: Abdomen | Wound class: Clean Contaminated

## 2011-08-21 MED ORDER — CEFOXITIN SODIUM-DEXTROSE 1-4 GM-% IV SOLR (PREMIX)
INTRAVENOUS | Status: AC
  Filled 2011-08-21: qty 100

## 2011-08-21 MED ORDER — METOCLOPRAMIDE HCL 5 MG/ML IJ SOLN
INTRAMUSCULAR | Status: DC | PRN
  Administered 2011-08-21: 10 mg via INTRAVENOUS

## 2011-08-21 MED ORDER — IOHEXOL 300 MG/ML  SOLN
INTRAMUSCULAR | Status: AC
  Filled 2011-08-21: qty 1

## 2011-08-21 MED ORDER — HEPARIN SODIUM (PORCINE) 5000 UNIT/ML IJ SOLN
INTRAMUSCULAR | Status: AC
  Filled 2011-08-21: qty 1

## 2011-08-21 MED ORDER — LIDOCAINE HCL (CARDIAC) 20 MG/ML IV SOLN
INTRAVENOUS | Status: DC | PRN
  Administered 2011-08-21: 100 mg via INTRAVENOUS

## 2011-08-21 MED ORDER — BUPIVACAINE LIPOSOME 1.3 % IJ SUSP
20.0000 mL | Freq: Once | INTRAMUSCULAR | Status: AC
  Administered 2011-08-21: 20 mL
  Filled 2011-08-21: qty 20

## 2011-08-21 MED ORDER — IOHEXOL 300 MG/ML  SOLN
INTRAMUSCULAR | Status: DC | PRN
  Administered 2011-08-21: 6 mL via INTRAVENOUS

## 2011-08-21 MED ORDER — ACETAMINOPHEN 10 MG/ML IV SOLN
INTRAVENOUS | Status: DC | PRN
  Administered 2011-08-21: 1000 mg via INTRAVENOUS

## 2011-08-21 MED ORDER — SODIUM CHLORIDE 0.9 % IR SOLN
Status: DC | PRN
  Administered 2011-08-21: 1000 mL

## 2011-08-21 MED ORDER — PROPOFOL 10 MG/ML IV EMUL
INTRAVENOUS | Status: DC | PRN
  Administered 2011-08-21: 140 ug/kg/min via INTRAVENOUS

## 2011-08-21 MED ORDER — MIDAZOLAM HCL 5 MG/5ML IJ SOLN
INTRAMUSCULAR | Status: DC | PRN
  Administered 2011-08-21: 1 mg via INTRAVENOUS
  Administered 2011-08-21: 2 mg via INTRAVENOUS

## 2011-08-21 MED ORDER — GLYCOPYRROLATE 0.2 MG/ML IJ SOLN
INTRAMUSCULAR | Status: DC | PRN
  Administered 2011-08-21: .6 mg via INTRAVENOUS

## 2011-08-21 MED ORDER — MORPHINE SULFATE 2 MG/ML IJ SOLN
1.0000 mg | INTRAMUSCULAR | Status: DC | PRN
  Administered 2011-08-21 – 2011-08-22 (×4): 1 mg via INTRAVENOUS
  Filled 2011-08-21 (×4): qty 1

## 2011-08-21 MED ORDER — SCOPOLAMINE 1 MG/3DAYS TD PT72
MEDICATED_PATCH | TRANSDERMAL | Status: DC | PRN
  Administered 2011-08-21: 1 via TRANSDERMAL

## 2011-08-21 MED ORDER — SUFENTANIL CITRATE 50 MCG/ML IV SOLN
INTRAVENOUS | Status: DC | PRN
  Administered 2011-08-21: 15 ug via INTRAVENOUS
  Administered 2011-08-21: 5 ug via INTRAVENOUS
  Administered 2011-08-21: 10 ug via INTRAVENOUS
  Administered 2011-08-21: 5 ug via INTRAVENOUS
  Administered 2011-08-21: 15 ug via INTRAVENOUS
  Administered 2011-08-21: 10 ug via INTRAVENOUS

## 2011-08-21 MED ORDER — ACETAMINOPHEN 10 MG/ML IV SOLN
INTRAVENOUS | Status: AC
  Filled 2011-08-21: qty 100

## 2011-08-21 MED ORDER — PROPOFOL 10 MG/ML IV EMUL
INTRAVENOUS | Status: DC | PRN
  Administered 2011-08-21: 200 mg via INTRAVENOUS

## 2011-08-21 MED ORDER — LACTATED RINGERS IV SOLN
INTRAVENOUS | Status: DC
  Administered 2011-08-21: 1000 mL via INTRAVENOUS
  Administered 2011-08-21: 17:00:00 via INTRAVENOUS

## 2011-08-21 MED ORDER — DEXTROSE 5 % IV SOLN
2.0000 g | INTRAVENOUS | Status: AC
  Administered 2011-08-21: 2 g via INTRAVENOUS

## 2011-08-21 MED ORDER — SUCCINYLCHOLINE CHLORIDE 20 MG/ML IJ SOLN
INTRAMUSCULAR | Status: DC | PRN
  Administered 2011-08-21: 100 mg via INTRAVENOUS

## 2011-08-21 MED ORDER — ROCURONIUM BROMIDE 100 MG/10ML IV SOLN
INTRAVENOUS | Status: DC | PRN
  Administered 2011-08-21: 10 mg via INTRAVENOUS
  Administered 2011-08-21: 40 mg via INTRAVENOUS

## 2011-08-21 MED ORDER — HEPARIN SODIUM (PORCINE) 5000 UNIT/ML IJ SOLN
5000.0000 [IU] | Freq: Once | INTRAMUSCULAR | Status: AC
  Administered 2011-08-21: 5000 [IU] via SUBCUTANEOUS

## 2011-08-21 MED ORDER — NEOSTIGMINE METHYLSULFATE 1 MG/ML IJ SOLN
INTRAMUSCULAR | Status: DC | PRN
  Administered 2011-08-21: 4 mg via INTRAVENOUS

## 2011-08-21 MED ORDER — ONDANSETRON HCL 4 MG/2ML IJ SOLN
INTRAMUSCULAR | Status: DC | PRN
  Administered 2011-08-21: 4 mg via INTRAVENOUS

## 2011-08-21 MED ORDER — DEXAMETHASONE SODIUM PHOSPHATE 10 MG/ML IJ SOLN
INTRAMUSCULAR | Status: DC | PRN
  Administered 2011-08-21: 10 mg via INTRAVENOUS

## 2011-08-21 MED ORDER — KCL IN DEXTROSE-NACL 20-5-0.45 MEQ/L-%-% IV SOLN
INTRAVENOUS | Status: DC
  Administered 2011-08-21 – 2011-08-22 (×2): via INTRAVENOUS
  Filled 2011-08-21 (×4): qty 1000

## 2011-08-21 MED ORDER — PROMETHAZINE HCL 25 MG/ML IJ SOLN
6.2500 mg | INTRAMUSCULAR | Status: DC | PRN
  Administered 2011-08-21: 12.5 mg via INTRAVENOUS

## 2011-08-21 MED ORDER — LACTATED RINGERS IR SOLN
Status: DC | PRN
  Administered 2011-08-21: 1000 mL

## 2011-08-21 MED ORDER — LACTATED RINGERS IV SOLN: INTRAVENOUS | Status: DC

## 2011-08-21 MED ORDER — HEPARIN SODIUM (PORCINE) 5000 UNIT/ML IJ SOLN
5000.0000 [IU] | Freq: Three times a day (TID) | INTRAMUSCULAR | Status: DC
  Administered 2011-08-21 – 2011-08-22 (×2): 5000 [IU] via SUBCUTANEOUS
  Filled 2011-08-21 (×7): qty 1

## 2011-08-21 MED ORDER — SCOPOLAMINE 1 MG/3DAYS TD PT72
MEDICATED_PATCH | TRANSDERMAL | Status: AC
  Filled 2011-08-21: qty 1

## 2011-08-21 MED ORDER — ONDANSETRON HCL 4 MG/2ML IJ SOLN: 4.0000 mg | Freq: Four times a day (QID) | INTRAMUSCULAR | Status: DC | PRN

## 2011-08-21 MED ORDER — HETASTARCH-ELECTROLYTES 6 % IV SOLN
INTRAVENOUS | Status: DC | PRN
  Administered 2011-08-21: 16:00:00 via INTRAVENOUS

## 2011-08-21 MED ORDER — PROMETHAZINE HCL 25 MG/ML IJ SOLN
INTRAMUSCULAR | Status: DC | PRN
  Administered 2011-08-21: 6.25 mg via INTRAVENOUS

## 2011-08-21 MED ORDER — HYDROMORPHONE HCL PF 1 MG/ML IJ SOLN: 0.2500 mg | INTRAMUSCULAR | Status: DC | PRN

## 2011-08-21 SURGICAL SUPPLY — 47 items
APPLIER CLIP 5 13 M/L LIGAMAX5 (MISCELLANEOUS)
APPLIER CLIP ROT 10 11.4 M/L (STAPLE)
BENZOIN TINCTURE PRP APPL 2/3 (GAUZE/BANDAGES/DRESSINGS) ×2 IMPLANT
CABLE HIGH FREQUENCY MONO STRZ (ELECTRODE) ×2 IMPLANT
CANISTER SUCTION 2500CC (MISCELLANEOUS) ×2 IMPLANT
CATH REDDICK CHOLANGI 4FR 50CM (CATHETERS) ×2 IMPLANT
CLIP APPLIE 5 13 M/L LIGAMAX5 (MISCELLANEOUS) IMPLANT
CLIP APPLIE ROT 10 11.4 M/L (STAPLE) IMPLANT
CLOTH BEACON ORANGE TIMEOUT ST (SAFETY) ×2 IMPLANT
COVER MAYO STAND STRL (DRAPES) ×2 IMPLANT
COVER SURGICAL LIGHT HANDLE (MISCELLANEOUS) ×2 IMPLANT
DECANTER SPIKE VIAL GLASS SM (MISCELLANEOUS) ×2 IMPLANT
DRAPE C-ARM 42X72 X-RAY (DRAPES) ×2 IMPLANT
DRAPE LAPAROSCOPIC ABDOMINAL (DRAPES) ×2 IMPLANT
DRAPE UTILITY XL STRL (DRAPES) ×2 IMPLANT
ELECT REM PT RETURN 9FT ADLT (ELECTROSURGICAL) ×2
ELECTRODE REM PT RTRN 9FT ADLT (ELECTROSURGICAL) ×1 IMPLANT
GAUZE SPONGE 2X2 8PLY STRL LF (GAUZE/BANDAGES/DRESSINGS) ×2 IMPLANT
GLOVE BIOGEL M 8.0 STRL (GLOVE) ×2 IMPLANT
GLOVE SURG SS PI 6.5 STRL IVOR (GLOVE) ×4 IMPLANT
GLOVE SURG SS PI 8.0 STRL IVOR (GLOVE) ×2 IMPLANT
GLOVE SURG SS PI 8.5 STRL IVOR (GLOVE) ×1
GLOVE SURG SS PI 8.5 STRL STRW (GLOVE) ×1 IMPLANT
GOWN STRL NON-REIN LRG LVL3 (GOWN DISPOSABLE) ×4 IMPLANT
GOWN STRL REIN XL XLG (GOWN DISPOSABLE) ×6 IMPLANT
HEMOSTAT SURGICEL 4X8 (HEMOSTASIS) IMPLANT
IV CATH 14GX2 1/4 (CATHETERS) ×2 IMPLANT
KIT BASIN OR (CUSTOM PROCEDURE TRAY) ×2 IMPLANT
NS IRRIG 1000ML POUR BTL (IV SOLUTION) ×2 IMPLANT
POUCH SPECIMEN RETRIEVAL 10MM (ENDOMECHANICALS) ×2 IMPLANT
SCISSORS LAP 5X35 DISP (ENDOMECHANICALS) ×2 IMPLANT
SET CHOLANGIOGRAPH MIX (MISCELLANEOUS) IMPLANT
SET IRRIG TUBING LAPAROSCOPIC (IRRIGATION / IRRIGATOR) ×2 IMPLANT
SLEEVE Z-THREAD 5X100MM (TROCAR) ×2 IMPLANT
SOLUTION ANTI FOG 6CC (MISCELLANEOUS) ×2 IMPLANT
SPONGE GAUZE 2X2 STER 10/PKG (GAUZE/BANDAGES/DRESSINGS) ×2
STRIP CLOSURE SKIN 1/2X4 (GAUZE/BANDAGES/DRESSINGS) ×2 IMPLANT
SUT VIC AB 4-0 SH 18 (SUTURE) ×2 IMPLANT
SYR 30ML LL (SYRINGE) ×2 IMPLANT
TOWEL OR 17X26 10 PK STRL BLUE (TOWEL DISPOSABLE) ×4 IMPLANT
TRAY LAP CHOLE (CUSTOM PROCEDURE TRAY) ×2 IMPLANT
TROCAR BLADELESS OPT 5 75 (ENDOMECHANICALS) ×2 IMPLANT
TROCAR XCEL BLUNT TIP 100MML (ENDOMECHANICALS) ×2 IMPLANT
TROCAR XCEL NON-BLD 11X100MML (ENDOMECHANICALS) IMPLANT
TROCAR Z-THREAD FIOS 11X100 BL (TROCAR) IMPLANT
TROCAR Z-THREAD FIOS 5X100MM (TROCAR) ×2 IMPLANT
TUBING INSUFFLATION 10FT LAP (TUBING) ×2 IMPLANT

## 2011-08-21 NOTE — Progress Notes (Signed)
Pt had a fleets enema 08/20/11 pm with good results

## 2011-08-21 NOTE — Anesthesia Preprocedure Evaluation (Signed)
Anesthesia Evaluation  Patient identified by MRN, date of birth, ID band Patient awake    Reviewed: Allergy & Precautions, H&P , NPO status , Patient's Chart, lab work & pertinent test results  History of Anesthesia Complications (+) PONV  Airway Mallampati: II TM Distance: >3 FB Neck ROM: Full    Dental No notable dental hx.    Pulmonary neg pulmonary ROS,  clear to auscultation  Pulmonary exam normal       Cardiovascular neg cardio ROS Regular Normal    Neuro/Psych  Headaches, Negative Neurological ROS  Negative Psych ROS   GI/Hepatic negative GI ROS, Neg liver ROS, GERD-  ,  Endo/Other  Negative Endocrine ROS  Renal/GU negative Renal ROS  Genitourinary negative   Musculoskeletal negative musculoskeletal ROS (+)   Abdominal (+) obese,   Peds negative pediatric ROS (+)  Hematology negative hematology ROS (+)   Anesthesia Other Findings   Reproductive/Obstetrics negative OB ROS                           Anesthesia Physical Anesthesia Plan  ASA: II  Anesthesia Plan: General   Post-op Pain Management:    Induction: Intravenous  Airway Management Planned: Oral ETT  Additional Equipment:   Intra-op Plan:   Post-operative Plan: Extubation in OR  Informed Consent: I have reviewed the patients History and Physical, chart, labs and discussed the procedure including the risks, benefits and alternatives for the proposed anesthesia with the patient or authorized representative who has indicated his/her understanding and acceptance.   Dental advisory given  Plan Discussed with: CRNA  Anesthesia Plan Comments:         Anesthesia Quick Evaluation

## 2011-08-21 NOTE — Anesthesia Postprocedure Evaluation (Signed)
  Anesthesia Post-op Note  Patient: Kathleen Carson  Procedure(s) Performed:  LAPAROSCOPIC CHOLECYSTECTOMY WITH INTRAOPERATIVE CHOLANGIOGRAM  Patient Location: PACU  Anesthesia Type: General  Level of Consciousness: awake and alert   Airway and Oxygen Therapy: Patient Spontanous Breathing  Post-op Pain: mild  Post-op Assessment: Post-op Vital signs reviewed, Patient's Cardiovascular Status Stable, Respiratory Function Stable, Patent Airway and No signs of Nausea or vomiting  Post-op Vital Signs: stable  Complications: No apparent anesthesia complications

## 2011-08-21 NOTE — Transfer of Care (Signed)
Immediate Anesthesia Transfer of Care Note  Patient: Kathleen Carson  Procedure(s) Performed:  LAPAROSCOPIC CHOLECYSTECTOMY WITH INTRAOPERATIVE CHOLANGIOGRAM  Patient Location: PACU  Anesthesia Type: General  Level of Consciousness: awake, alert , oriented and patient cooperative  Airway & Oxygen Therapy: Patient Spontanous Breathing and Patient connected to face mask oxygen  Post-op Assessment: Report given to PACU RN and Post -op Vital signs reviewed and stable  Post vital signs: stable Filed Vitals:   08/21/11 1332  BP: 128/86  Pulse: 99  Temp: 36.7 C  Resp: 16    Complications: No apparent anesthesia complications

## 2011-08-21 NOTE — Op Note (Signed)
Surgeon: Wenda Low, MD, FACS  Asst:  none  Anes:  general  Procedure: Lap cholecystectomy with IOC  Diagnosis: Probable passing CBD stones although IOC appeared normal   Complications: None   EBL:   Minimal  cc  Description of Procedure:  The patient was taken to or 11 on the afternoon of Tuesday, 08/21/2011.Marland Kitchen After general anesthesia was administered the abdomen was prepped with PCMX and draped sterilely. [employer] the sheath to the umbilicus using Hassan technique without difficulty. A survey her abdomen a tub photographed and gave her a picture of a small left inguinal hernia. Also looked at her appendix which was long and seemed to have a pelvic lie and looked absolutely normal.. I was unable to visualize her hiatus. The gallbladder itself appeared to have a phrygian cap and it was elevated and exposed Kalos triangle. I dissected this was very much out of its on the cleaned off free well in place clips on the cystic artery pedicle and a clip on the gallbladder. I incised the cystic duct into the dynamic cholangiogram which showed good intrahepatic filling and free flow into the duodenum. The cystic duct was then triple clipped and divided and the cystic arteries were double clipped divided the gallbladder was removed the gallbladder bed without entering it. As placed in a bag and brought to the umbilicus where I could feel BB size stones within it. Also mention awakened in the cystic duct the first bit of bile through the cystic duct the, the backwash was dark with small stones.  Colors moving the umbilicus. The umbilical defect was repaired with a 2-0 Vicryl center laparoscopic vision. Ports were all injected with a Exparel. Incisions were closed with 4-0 Vicryl benzoin and Steri-Strips. Patient are that she is well was taken to recovery in satisfactory condition. Matt B. Daphine Deutscher, MD, [facility], [place] [phone]

## 2011-08-21 NOTE — H&P (Signed)
Valarie Merino, MD 08/17/2011 12:04 PM Signed  Chief Complaint: Recurrent attacks of biliary colic  History of Present Illness: Kathleen Carson is an 41 y.o. female who presented to ER in Dec with attack of RUQ pain. The following ultrasound was obtained:  07/21/2011 *RADIOLOGY REPORT* Clinical Data: Epigastric pain. ABDOMINAL ULTRASOUND COMPLETE Comparison: None. Findings: Gallbladder: Multiple small shadowing calculi are identified within the dependent portion of the gallbladder. The gallbladder wall is increased in thickness measuring 4.7 mm. Positive sonographic Murphy's sign. Common Bile Duct: Within normal limits in caliber. Liver: The liver parenchyma has a slightly coarsened echotexture. There is no mass identified. IVC: Appears normal. Pancreas: No abnormality identified. Spleen: Within normal limits in size and echotexture. Right kidney: Normal in size and parenchymal echogenicity. No evidence of mass or hydronephrosis. Left kidney: Normal in size and parenchymal echogenicity. No evidence of mass or hydronephrosis. Abdominal Aorta: No aneurysm identified. IMPRESSION: 1. Gallstones and gallbladder wall thickening. There is also a positive sonographic Murphy's sign. Cannot rule out acute cholecystitis. 2. Fatty infiltration of the liver. Original Report Authenticated By: Rosealee Albee, M.D.  She had elevated LFTs and was asked to return to the office He mom is an old patient of [facility]'s and me.  Past Medical History   Diagnosis  Date   .  Ulcer    .  Gallbladder attack    .  Anemia    .  Arthritis    .  Abdominal distension    .  Constipation    .  Nausea    .  Abdominal pain    .  Weakness    .  Generalized headaches    .  Bruises easily    .  Migraines    .  Breast mass     Past Surgical History   Procedure  Date   .  Tonsillectomy  1976   .  Hernia repair  1974   .  Abdominal hysterectomy  2009   .  Appendectomy     Current Outpatient Prescriptions   Medication  Sig   Dispense  Refill   .  estradiol (ESTRACE) 2 MG tablet  Take 2 mg by mouth daily.     Marland Kitchen  FLUoxetine (PROZAC) 20 MG capsule  Take 20 mg by mouth daily.     .  ondansetron (ZOFRAN ODT) 4 MG disintegrating tablet  Take 1 tablet (4 mg total) by mouth every 8 (eight) hours as needed for nausea.  20 tablet  0   .  oxyCODONE-acetaminophen (ROXICET) 5-325 MG per tablet  Take 1 tablet by mouth every 6 (six) hours as needed for pain.  30 tablet  0   .  progesterone (PROMETRIUM) 100 MG capsule  Take 100 mg by mouth daily.      Codeine  Family History   Problem  Relation  Age of Onset   .  Colon polyps  Mother    .  Cancer  Maternal Grandfather       colon    .  Cancer  Paternal Grandmother       breast    Social History: reports that she has never smoked. She has never used smokeless tobacco. She reports that she does not drink alcohol or use illicit drugs.  REVIEW OF SYSTEMS - PERTINENT POSITIVES ONLY:  Left inguinal pain possible hernia.  Physical Exam:  Blood pressure 116/68, pulse 68, temperature 97.8 F (36.6 C), temperature source Temporal, resp. rate 18, height 5\' 5"  (  1.651 m), weight 203 lb 6.4 oz (92.262 kg).  Body mass index is 33.85 kg/(m^2).  Gen: WDWN white female in NAD  Neurological: Alert and oriented to person, place, and time. Motor and sensory function is grossly intact  Head: Normocephalic and atraumatic.  Eyes: Conjunctivae are normal. Pupils are equal, round, and reactive to light. No scleral icterus.  Neck: Normal range of motion. Neck supple. No tracheal deviation or thyromegaly present.  Cardiovascular: SR without murmurs or gallops. No carotid bruits  Respiratory: Effort normal. No respiratory distress. No chest wall tenderness. Breath sounds normal. No wheezes, rales or rhonchi.  Abdomen: Sore in mid abdomen. History of pain going into back concern for passage of small stones  GU:  Musculoskeletal: Normal range of motion. Extremities are nontender. No cyanosis,  edema or clubbing noted Lymphadenopathy: No cervical, preauricular, postauricular or axillary adenopathy is present Skin: Skin is warm and dry. No rash noted. No diaphoresis. No erythema. No pallor. Pscyh: Normal mood and affect. Behavior is normal. Judgment and thought content normal.  LABORATORY RESULTS:  No results found for this or any previous visit (from the past 48 hour(s)).  RADIOLOGY RESULTS:  No results found.  Problem List:  Patient Active Problem List   Diagnoses   .  Gallstones history of cholecystis Dec 2012    Assessment & Plan:  Will give rx for roxicet and zofran in the near term. Schedule for Lap Chole IOC soon  Matt B. Daphine Deutscher, MD, [facility]  [phone] beeper  [phone]

## 2011-08-22 DIAGNOSIS — K4090 Unilateral inguinal hernia, without obstruction or gangrene, not specified as recurrent: Secondary | ICD-10-CM

## 2011-08-22 DIAGNOSIS — K219 Gastro-esophageal reflux disease without esophagitis: Secondary | ICD-10-CM

## 2011-08-22 MED ORDER — OXYCODONE-ACETAMINOPHEN 5-325 MG PO TABS: 1.0000 | ORAL_TABLET | ORAL | Status: AC | PRN

## 2011-08-22 MED ORDER — PHENYLEPHRINE IN HARD FAT 0.25 % RE SUPP: 1.0000 | Freq: Two times a day (BID) | RECTAL | Status: AC

## 2011-08-22 NOTE — Discharge Summary (Signed)
Physician Discharge Summary  Patient ID: Kathleen Carson MRN: [id number] DOB/AGE: [birth year] 41 y.o.  Admit date: 08/21/2011 Discharge date: 08/22/2011  Admission Diagnoses:  Discharge Diagnoses:  Active Problems:  Inguinal hernia, left  GERD (gastroesophageal reflux disease)   Discharged Condition: good  Hospital Course: Had lap chole with IOC and stayed for overnight observation  Consults: none  Significant Diagnostic Studies: none  Treatments: surgery: Lap chole IOC  Discharge Exam: Blood pressure 90/60, pulse 85, temperature 98 F (36.7 C), temperature source Oral, resp. rate 20, height 5\' 5"  (1.651 m), weight 205 lb (92.987 kg), SpO2 94.00%. normal post op  Disposition: Home or Self Care  Discharge Orders    Future Orders Please Complete By Expires   Diet - low sodium heart healthy      Increase activity slowly      Discharge instructions      Comments:   Take pain meds if needed.  Gradually advance diet as tolerated     Medication List  As of 08/22/2011  9:09 AM   TAKE these medications         ALIGN PO   Take 1 capsule by mouth daily.      estradiol 2 MG tablet   Commonly known as: ESTRACE   Take 2 mg by mouth daily after breakfast.      FLUoxetine 20 MG capsule   Commonly known as: PROZAC   Take 20 mg by mouth daily after breakfast.      naproxen sodium 220 MG tablet   Commonly known as: ANAPROX   Take 440 mg by mouth 2 (two) times daily as needed. Pain        ondansetron 4 MG disintegrating tablet   Commonly known as: ZOFRAN-ODT   Take 1 tablet (4 mg total) by mouth every 8 (eight) hours as needed for nausea.      oxyCODONE-acetaminophen 5-325 MG per tablet   Commonly known as: PERCOCET   Take 1 tablet by mouth every 6 (six) hours as needed for pain.      oxyCODONE-acetaminophen 5-325 MG per tablet   Commonly known as: PERCOCET   Take 1 tablet by mouth every 4 (four) hours as needed for pain.      phenylephrine 0.25 % suppository   Commonly known as: (USE for PREPARATION-H)   Place 1 suppository rectally 2 (two) times daily.      progesterone 100 MG capsule   Commonly known as: PROMETRIUM   Take 100 mg by mouth at bedtime.           Follow-up Information    Follow up with Luretha Murphy B, MD. Schedule an appointment as soon as possible for a visit in 3 weeks.   Contact information:   [employer] [address] [place] [address] [phone]          Signed: Valarie Merino 08/22/2011, 9:09 AM

## 2011-08-22 NOTE — Progress Notes (Signed)
Patient ID: Kathleen Carson, female   DOB: [birth year], 41 y.o.   MRN: [id number] [facility] Progress Note:   1 Day Post-Op  Subjective: Sore but otherwise doing well Objective: Vital signs in last 24 hours: Temp:  [97.3 F (36.3 C)-98 F (36.7 C)] 98 F (36.7 C) (01/16 0648) Pulse Rate:  [72-99] 85  (01/16 0648) Resp:  [11-20] 20  (01/16 0648) BP: (90-148)/(60-86) 90/60 mmHg (01/16 0648) SpO2:  [94 %-100 %] 94 % (01/16 0648) Weight:  [205 lb (92.987 kg)] 205 lb (92.987 kg) (01/15 1800)  Intake/Output from previous day: 01/15 0701 - 01/16 0700 In: 3025 [I.V.:2525; IV Piggyback:500] Out: 4200 [Urine:4200] Intake/Output this shift:    Physical Exam:  Steri strips in place Lab Results:   Basename 08/20/11 1435  WBC 7.7  HGB 14.1  HCT 40.7  PLT 308   BMET  Basename 08/20/11 1435  NA 137  K 4.2  CL 102  CO2 27  GLUCOSE 88  BUN 11  CREATININE 0.92  CALCIUM 10.1   PT/INR No results found for this basename: LABPROT:2,INR:2 in the last 72 hours Studies/Results: Dg Cholangiogram Operative  08/21/2011  *RADIOLOGY REPORT*  Clinical Data:   Cholecystitis.  Cholelithiasis.  INTRAOPERATIVE CHOLANGIOGRAM  Technique:  Cholangiographic images from the C-arm fluoroscopic device were submitted for interpretation post-operatively.  Please see the procedural report for the amount of contrast and the fluoroscopy time utilized.  Comparison:  Abdominal ultrasound 07/21/2011.  Findings:  Cine fluoroscopic spot images from an intraoperative cholangiogram demonstrate cannulation of the cystic duct.  The common bile duct is normal in caliber.  No filling defects are seen.  There is normal drainage of contrast into the duodenum.  The visualized intrahepatic ducts appear normal.  No contrast extravasation is seen.  IMPRESSION: Normal intraoperative cholangiogram.  Original Report Authenticated By: P. Loralie Champagne, M.D.   Anti-infectives: Anti-infectives     Start     Dose/Rate  Route Frequency Ordered Stop   08/21/11 1345   cefOXitin (MEFOXIN) 2 g in dextrose 5 % 50 mL IVPB        2 g 100 mL/hr over 30 Minutes Intravenous 60 min pre-op 08/21/11 1332 08/21/11 1548          Assessment/Plan: Problem List: Patient Active Problem List  Diagnoses  . Gallstones history of cholecystis Dec 2012    Ready for discharge with pain and nausea meds 1 Day Post-Op    LOS: 1 day   Matt B. Daphine Deutscher, MD, [facility] [phone] beeper [phone]  08/22/2011 8:58 AM

## 2011-08-23 ENCOUNTER — Encounter (HOSPITAL_COMMUNITY): Payer: Self-pay | Admitting: Surgery

## 2011-09-24 ENCOUNTER — Telehealth (INDEPENDENT_AMBULATORY_CARE_PROVIDER_SITE_OTHER): Payer: Self-pay | Admitting: General Surgery

## 2011-09-24 NOTE — Telephone Encounter (Signed)
Kathleen Carson contacted the office stating she has had some epigastric pain recently and would like to schedule an appt. Her schedule is very busy and she has not been able to come in for her post op appt. Scheduled her to come to the clinic on wed for a post op

## 2011-09-26 ENCOUNTER — Encounter (INDEPENDENT_AMBULATORY_CARE_PROVIDER_SITE_OTHER): Payer: Self-pay | Admitting: Surgery

## 2011-09-26 ENCOUNTER — Ambulatory Visit (INDEPENDENT_AMBULATORY_CARE_PROVIDER_SITE_OTHER): Payer: Medicaid Other | Admitting: Surgery

## 2011-09-26 DIAGNOSIS — Z9049 Acquired absence of other specified parts of digestive tract: Secondary | ICD-10-CM | POA: Insufficient documentation

## 2011-09-26 DIAGNOSIS — Z9889 Other specified postprocedural states: Secondary | ICD-10-CM

## 2011-09-26 MED ORDER — PANTOPRAZOLE SODIUM 20 MG PO TBEC: 20.0000 mg | DELAYED_RELEASE_TABLET | Freq: Every day | ORAL | Status: AC

## 2011-09-26 NOTE — Progress Notes (Signed)
Kathleen Carson 41 y.o.  Body mass index is 33.91 kg/(m^2).  Patient Active Problem List  Diagnoses  . Inguinal hernia, left  . GERD (gastroesophageal reflux disease)    Allergies  Allergen Reactions  . Adhesive (Tape) Other (See Comments)    Tears skin  . Codeine Nausea And Vomiting    Past Surgical History  Procedure Date  . Tonsillectomy 1976  . Abdominal hysterectomy 2009  . Cholecystectomy 08/21/2011    Procedure: LAPAROSCOPIC CHOLECYSTECTOMY WITH INTRAOPERATIVE CHOLANGIOGRAM;  Surgeon: Valarie Merino, MD;  Location: [facility];  Service: General;  Laterality: N/A;  . Hernia repair 1974   Provider Not In System No diagnosis found.  Kathleen Carson comes back today after her laparoscopic cholecystectomy. Her incisions are healing nicely. I did fine she has a left inguinal hernia which he wanted it fixed at some point. Today's complaint is midepigastric pain which feels like an ulcer that she had when she was an adolescent. For that we are going to order of breath tach H. Pylori study and then I will give her a prescription for a proton pump inhibitor probably protonic 20 mg q.d. She needs to get the H. Pylori study done prior to starting the prescription. I'll see her back in 3 weeks and see she's doing. Kathleen B. Daphine Deutscher, MD, [facility] [phone] beeper [phone]  09/26/2011 5:43 PM

## 2011-09-26 NOTE — Patient Instructions (Signed)
Obtain H. Pylori test After H. Pylori test he may start the Protonix 20 mg q.d.

## 2011-09-27 ENCOUNTER — Other Ambulatory Visit (INDEPENDENT_AMBULATORY_CARE_PROVIDER_SITE_OTHER): Payer: Self-pay | Admitting: General Surgery

## 2011-09-27 DIAGNOSIS — K219 Gastro-esophageal reflux disease without esophagitis: Secondary | ICD-10-CM

## 2011-10-11 ENCOUNTER — Other Ambulatory Visit: Payer: Self-pay | Admitting: Family Medicine

## 2011-10-11 DIAGNOSIS — N6009 Solitary cyst of unspecified breast: Secondary | ICD-10-CM

## 2011-10-11 DIAGNOSIS — N6489 Other specified disorders of breast: Secondary | ICD-10-CM

## 2011-10-19 ENCOUNTER — Encounter (INDEPENDENT_AMBULATORY_CARE_PROVIDER_SITE_OTHER): Payer: Medicaid Other | Admitting: Surgery

## 2011-10-24 ENCOUNTER — Ambulatory Visit
Admission: RE | Admit: 2011-10-24 | Discharge: 2011-10-24 | Disposition: A | Discharge status: Home / Self Care | Payer: Medicaid Other | Source: Ambulatory Visit | Attending: Family Medicine | Admitting: Family Medicine

## 2011-10-24 DIAGNOSIS — N6489 Other specified disorders of breast: Secondary | ICD-10-CM

## 2011-10-24 DIAGNOSIS — N6009 Solitary cyst of unspecified breast: Secondary | ICD-10-CM

## 2011-10-31 ENCOUNTER — Encounter (INDEPENDENT_AMBULATORY_CARE_PROVIDER_SITE_OTHER): Payer: Self-pay | Admitting: Surgery

## 2021-11-09 ENCOUNTER — Emergency Department (HOSPITAL_COMMUNITY): Payer: Medicaid - Out of State

## 2021-11-09 ENCOUNTER — Encounter (HOSPITAL_COMMUNITY): Payer: Self-pay

## 2021-11-09 ENCOUNTER — Other Ambulatory Visit: Payer: Self-pay

## 2021-11-09 ENCOUNTER — Emergency Department (HOSPITAL_COMMUNITY)
Admission: EM | Admit: 2021-11-09 | Discharge: 2021-11-09 | Disposition: A | Discharge status: Home / Self Care | Payer: Medicaid - Out of State | Attending: Emergency Medicine | Admitting: Emergency Medicine

## 2021-11-09 DIAGNOSIS — R202 Paresthesia of skin: Secondary | ICD-10-CM | POA: Insufficient documentation

## 2021-11-09 DIAGNOSIS — R110 Nausea: Secondary | ICD-10-CM | POA: Diagnosis not present

## 2021-11-09 DIAGNOSIS — R0789 Other chest pain: Secondary | ICD-10-CM | POA: Diagnosis not present

## 2021-11-09 DIAGNOSIS — R519 Headache, unspecified: Secondary | ICD-10-CM | POA: Insufficient documentation

## 2021-11-09 HISTORY — DX: Pure hypercholesterolemia, unspecified: E78.00

## 2021-11-09 LAB — HEPATIC FUNCTION PANEL
ALT: 13 U/L (ref 0–44)
AST: 25 U/L (ref 15–41)
Albumin: 3.5 g/dL (ref 3.5–5.0)
Alkaline Phosphatase: 69 U/L (ref 38–126)
Bilirubin, Direct: 0.3 mg/dL — ABNORMAL HIGH (ref 0.0–0.2)
Indirect Bilirubin: 0.6 mg/dL (ref 0.3–0.9)
Total Bilirubin: 0.9 mg/dL (ref 0.3–1.2)
Total Protein: 6.4 g/dL — ABNORMAL LOW (ref 6.5–8.1)

## 2021-11-09 LAB — CBC
HCT: 42.5 % (ref 36.0–46.0)
Hemoglobin: 14.1 g/dL (ref 12.0–15.0)
MCH: 29.9 pg (ref 26.0–34.0)
MCHC: 33.2 g/dL (ref 30.0–36.0)
MCV: 90.2 fL (ref 80.0–100.0)
Platelets: 329 10*3/uL (ref 150–400)
RBC: 4.71 MIL/uL (ref 3.87–5.11)
RDW: 12.3 % (ref 11.5–15.5)
WBC: 7.1 10*3/uL (ref 4.0–10.5)
nRBC: 0 % (ref 0.0–0.2)

## 2021-11-09 LAB — TROPONIN I (HIGH SENSITIVITY)
Troponin I (High Sensitivity): 3 ng/L (ref <18)
Troponin I (High Sensitivity): 3 ng/L (ref <18)

## 2021-11-09 LAB — BASIC METABOLIC PANEL
Anion gap: 8 (ref 5–15)
BUN: 13 mg/dL (ref 6–20)
CO2: 23 mmol/L (ref 22–32)
Calcium: 9.2 mg/dL (ref 8.9–10.3)
Chloride: 108 mmol/L (ref 98–111)
Creatinine, Ser: 1.27 mg/dL — ABNORMAL HIGH (ref 0.44–1.00)
GFR, Estimated: 52 mL/min — ABNORMAL LOW (ref >60)
Glucose, Bld: 129 mg/dL — ABNORMAL HIGH (ref 70–99)
Potassium: 3.9 mmol/L (ref 3.5–5.1)
Sodium: 139 mmol/L (ref 135–145)

## 2021-11-09 LAB — LIPASE, BLOOD: Lipase: 30 U/L (ref 11–51)

## 2021-11-09 MED ORDER — ONDANSETRON 4 MG PO TBDP
4.0000 mg | ORAL_TABLET | Freq: Once | ORAL | Status: AC
  Administered 2021-11-09: 4 mg via ORAL
  Filled 2021-11-09: qty 1

## 2021-11-09 MED ORDER — PROCHLORPERAZINE MALEATE 10 MG PO TABS: 10.0000 mg | ORAL_TABLET | Freq: Four times a day (QID) | ORAL | 0 refills | Status: AC | PRN

## 2021-11-09 MED ORDER — PROCHLORPERAZINE EDISYLATE 10 MG/2ML IJ SOLN
10.0000 mg | Freq: Once | INTRAMUSCULAR | Status: AC
  Administered 2021-11-09: 10 mg via INTRAVENOUS
  Filled 2021-11-09: qty 2

## 2021-11-09 MED ORDER — SODIUM CHLORIDE 0.9 % IV BOLUS
1000.0000 mL | Freq: Once | INTRAVENOUS | Status: AC
  Administered 2021-11-09: 1000 mL via INTRAVENOUS

## 2021-11-09 NOTE — ED Triage Notes (Signed)
Pt reports headache, vomiting, tiredness and chest tingling. Headache started Sunday but the other symptoms started yesterday. ?

## 2021-11-09 NOTE — ED Provider Notes (Signed)
? ?Emergency Department Provider Note ? ? ?I have reviewed the triage vital signs and the nursing notes. ? ? ?HISTORY ? ?Chief Complaint ?Chest Pain and Headache ? ? ?HPI ?LANETTA FIGUERO is a 51 y.o. female presents to the ED with atypical HA for her that began suddenly. She feels a band of pain across the forehead. Notes that she gets frequent HA but is typically behind the eyes. No vision change. Some photophobia here. No numbness. HA did begin suddenly. She has been taking Goodie's Powder at home with some temporary relief but then HA returns. No numbness/weakness. She has associated nausea without abdominal pain or vomiting. No diarrhea. Notes a "tingling" in the chest but no specific pain.  ? ? ?Past Medical History:  ?Diagnosis Date  ? Abdominal distension   ? Abdominal pain   ? Arthritis   ? Breast mass   ? Bruises easily   ? Constipation   ? Gallbladder attack   ? Generalized headaches   ? GERD (gastroesophageal reflux disease)   ? High cholesterol   ? Migraines   ? Nausea   ? PONV (postoperative nausea and vomiting)   ? n&v after epidurals and general anesthesia--pt has motion sickness  and has had vertigo  ? Ulcer   ? Weakness   ? poss recent virus that made pt feel weak--feeling much better now  ? ? ?Review of Systems ? ?Constitutional: No fever/chills ?Eyes: No visual changes. ?ENT: No sore throat. ?Cardiovascular: Denies chest pain. Positive "tingling" in the chest.  ?Respiratory: Denies shortness of breath. ?Gastrointestinal: No abdominal pain. Positive nausea, no vomiting.  No diarrhea.  No constipation. ?Genitourinary: Negative for dysuria. ?Musculoskeletal: Negative for back pain. ?Skin: Negative for rash. ?Neurological: Negative for focal weakness or numbness. Positive HA.  ? ? ?____________________________________________ ? ? ?PHYSICAL EXAM: ? ?VITAL SIGNS: ?ED Triage Vitals  ?Enc Vitals Group  ?   BP 11/09/21 1414 (!) 141/94  ?   Pulse Rate 11/09/21 1414 96  ?   Resp 11/09/21 1414 16  ?   Temp  11/09/21 1414 98 ?F (36.7 ?C)  ?   Temp src --   ?   SpO2 11/09/21 1414 96 %  ?   Weight 11/09/21 1415 230 lb (104.3 kg)  ?   Height 11/09/21 1415 5\' 5"  (1.651 m)  ? ?Constitutional: Alert and oriented. Well appearing and in no acute distress. ?Eyes: Conjunctivae are normal. Mild photophobia. ?Head: Atraumatic. ?Nose: No congestion/rhinnorhea. ?Mouth/Throat: Mucous membranes are moist.   ?Neck: No stridor.   ?Cardiovascular: Normal rate, regular rhythm. Good peripheral circulation. Grossly normal heart sounds.   ?Respiratory: Normal respiratory effort.  No retractions. Lungs CTAB. ?Gastrointestinal: Soft and nontender. No distention.  ?Musculoskeletal: No lower extremity tenderness nor edema. No gross deformities of extremities. ?Neurologic:  Normal speech and language. No gross focal neurologic deficits are appreciated.  ?Skin:  Skin is warm, dry and intact. No rash noted. ? ?____________________________________________ ?  ?LABS ?(all labs ordered are listed, but only abnormal results are displayed) ? ?Labs Reviewed  ?BASIC METABOLIC PANEL - Abnormal; Notable for the following components:  ?    Result Value  ? Glucose, Bld 129 (*)   ? Creatinine, Ser 1.27 (*)   ? GFR, Estimated 52 (*)   ? All other components within normal limits  ?HEPATIC FUNCTION PANEL - Abnormal; Notable for the following components:  ? Total Protein 6.4 (*)   ? Bilirubin, Direct 0.3 (*)   ? All other components within normal  limits  ?RESP PANEL BY RT-PCR (FLU A&B, COVID) ARPGX2  ?CBC  ?LIPASE, BLOOD  ?TROPONIN I (HIGH SENSITIVITY)  ?TROPONIN I (HIGH SENSITIVITY)  ? ?____________________________________________ ? ?EKG ? ? EKG Interpretation ? ?Date/Time:  Thursday November 09 2021 14:14:30 EDT ?Ventricular Rate:  96 ?PR Interval:  112 ?QRS Duration: 76 ?QT Interval:  328 ?QTC Calculation: 414 ?R Axis:   67 ?Text Interpretation: Normal sinus rhythm Normal ECG When compared with ECG of 25-Jun-2011 20:12, PREVIOUS ECG IS PRESENT Confirmed by Alona Bene [phone]) on 11/09/2021 5:33:45 PM ?  ? ?  ? ? ?____________________________________________ ? ?RADIOLOGY ? ?DG Chest 1 View ? ?Result Date: 11/09/2021 ?CLINICAL DATA:  Chest pain.  Headache. EXAM: CHEST  1 VIEW COMPARISON:  None. FINDINGS: The cardiomediastinal contours are normal. The lungs are clear. Pulmonary vasculature is normal. No consolidation, pleural effusion, or pneumothorax. No acute osseous abnormalities are seen. IMPRESSION: No acute chest findings. Electronically Signed   By: Narda Rutherford M.D.   On: 11/09/2021 15:19  ? ?CT Head Wo Contrast ? ?Result Date: 11/09/2021 ?CLINICAL DATA:  Headache, sudden, severe. Frontal headache for 5 days. Nausea. EXAM: CT HEAD WITHOUT CONTRAST TECHNIQUE: Contiguous axial images were obtained from the base of the skull through the vertex without intravenous contrast. RADIATION DOSE REDUCTION: This exam was performed according to the departmental dose-optimization program which includes automated exposure control, adjustment of the mA and/or kV according to patient size and/or use of iterative reconstruction technique. COMPARISON:  None. FINDINGS: Brain: No evidence of parenchymal hemorrhage or extra-axial fluid collection. No mass lesion, mass effect, or midline shift. No CT evidence of acute infarction. Cerebral volume is age appropriate. No ventriculomegaly. Vascular: No acute abnormality. Skull: No evidence of calvarial fracture. Sinuses/Orbits: The visualized paranasal sinuses are essentially clear. Other:  The mastoid air cells are unopacified. IMPRESSION: Negative noncontrast head CT. No evidence of acute intracranial abnormality. Electronically Signed   By: Delbert Phenix M.D.   On: 11/09/2021 18:48   ? ?____________________________________________ ? ? ?PROCEDURES ? ?Procedure(s) performed:  ? ?Procedures ? ?None ?____________________________________________ ? ? ?INITIAL IMPRESSION / ASSESSMENT AND PLAN / ED COURSE ? ?Pertinent labs & imaging results that were  available during my care of the patient were reviewed by me and considered in my medical decision making (see chart for details). ?  ?This patient is Presenting for Evaluation of HA, which does require a range of treatment options, and is a complaint that involves a high risk of morbidity and mortality. ? ?The Differential Diagnoses includes but is not exclusive to subarachnoid hemorrhage, meningitis, encephalitis, previous head trauma, cavernous venous thrombosis, muscle tension headache, glaucoma, temporal arteritis, migraine or migraine equivalent, etc. ? ? ?Critical Interventions-  ?  ?Medications  ?ondansetron (ZOFRAN-ODT) disintegrating tablet 4 mg (4 mg Oral Given 11/09/21 1454)  ?sodium chloride 0.9 % bolus 1,000 mL (0 mLs Intravenous Stopped 11/09/21 1954)  ?prochlorperazine (COMPAZINE) injection 10 mg (10 mg Intravenous Given 11/09/21 1754)  ? ? ?Reassessment after intervention: HA significant improved. ? ? ?I did obtain Additional Historical Information from daughter at bedside. ?  ?Clinical Laboratory Tests Ordered, included troponin WNL. No AKI. Normal electrolytes. Lipase and LFTs are normal.  ? ?Radiologic Tests Ordered, included CT head and CXR. I independently interpreted the images and agree with radiology interpretation.  ? ? ?Social Determinants of Health Risk patient is a non-smoker.  ? ?Medical Decision Making: Summary:  ?Patient presents to the ED with HA and nausea. Some associated "tingling" in the chest without pain  or SOB. HA new and sudden onset. Patient is 50. Plan for CT head, which is unremarkable. ACS workup given family history is also reassuring. HA resolved with IVF and compazine. Sent Rx to pharmacy. Patient with frequent HA at home. Have placed a referral in our system for Neurology follow up.  ? ?Reevaluation with update and discussion with patient. Meds sent to pharmacy of choice. Discussed follow up plan and ED return precautions.  ? ?Disposition:  discharge ? ?____________________________________________ ? ?FINAL CLINICAL IMPRESSION(S) / ED DIAGNOSES ? ?Final diagnoses:  ?Bad headache  ?Chest discomfort  ?Nausea  ? ? ? ?NEW OUTPATIENT MEDICATIONS STARTED DURING THIS VISIT: ? ?Dischar

## 2021-11-09 NOTE — ED Provider Triage Note (Signed)
Emergency Medicine Provider Triage Evaluation Note ? ?Glean Salen , a 51 y.o. female  was evaluated in triage. Pt complains of frontal headache onset 5 days. Denies sick contacts. Associated nausea (worse yesterday), decreased appetite, fatigue, chest pain (yesterday), vomiting x1 episode yesterday.  Patient notes she tried Goody powder and Zofran normally for her symptoms.  Denies rhinorrhea, nasal congestion, sore throat, vision changes.  Denies past medical history of CAD, hypertension, MI, cardiac stents, cardiac catheterization.  She notes that she has a history of prediabetes. ? ? ?Review of Systems  ?Positive: As per HPI above ?Negative:  ? ?Physical Exam  ?BP (!) 141/94 (BP Location: Right Arm)   Pulse 96   Temp 98 ?F (36.7 ?C)   Resp 16   Ht 5\' 5"  (1.651 m)   Wt 104.3 kg   SpO2 96%   BMI 38.27 kg/m?  ?Gen:   Awake, no distress   ?Resp:  Normal effort  ?MSK:   Moves extremities without difficulty  ?Other:  No focal neurological deficits.  Negative pronator drift.  PERRL.  EOMI. ? ?Medical Decision Making  ?Medically screening exam initiated at 2:48 PM.  Appropriate orders placed.  DAINA CARA was informed that the remainder of the evaluation will be completed by another provider, this initial triage assessment does not replace that evaluation, and the importance of remaining in the ED until their evaluation is complete. ?  ?Didier Brandenburg A, PA-C ?11/09/21 1455 ? ?

## 2021-11-09 NOTE — Discharge Instructions (Signed)

## 2023-09-16 IMAGING — CT CT HEAD W/O CM
4 series · 14 of 47 positions shown, 16 images · non-contrast
Comparison: None.

CLINICAL DATA: Headache, sudden, severe. Frontal headache for 5
days. Nausea.



[Series 3: head without ax · axial · non-contrast · 0.30mm/px · z∈[-112,+8]mm · 7 of 33 slices shown, 9 images]
[im 5/33  brain]
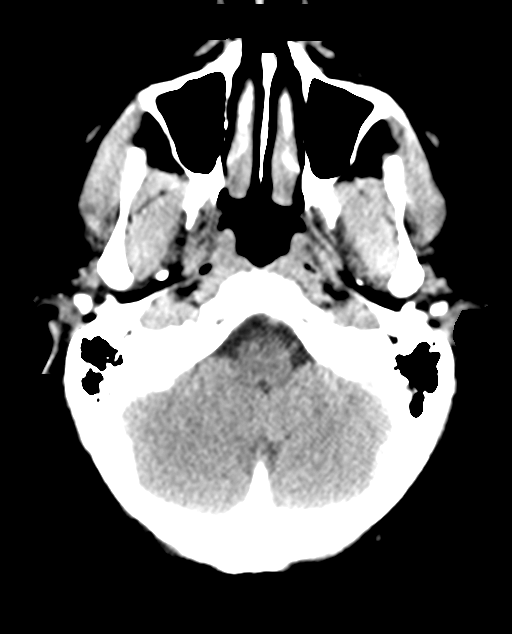
[im 5/33  bone]
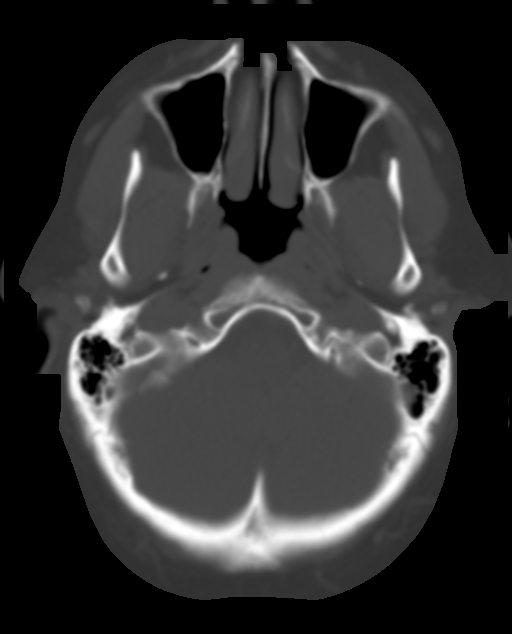
[im 9/33  brain]
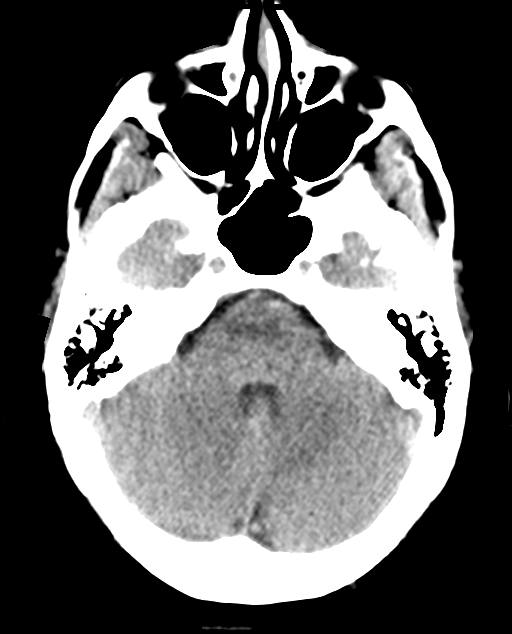
[im 13/33  brain]
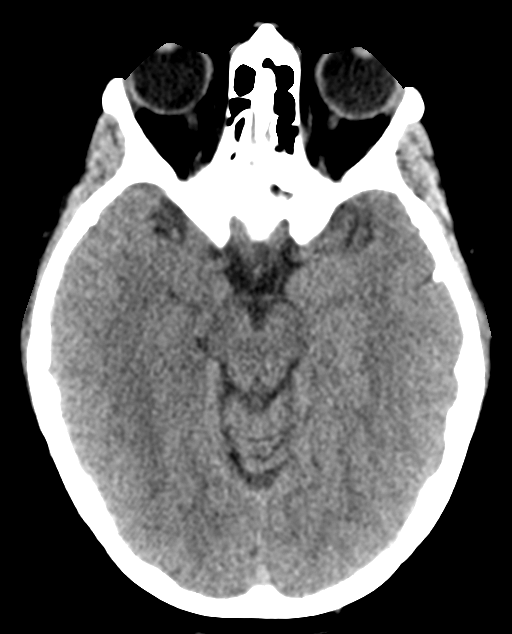
[im 17/33  brain]
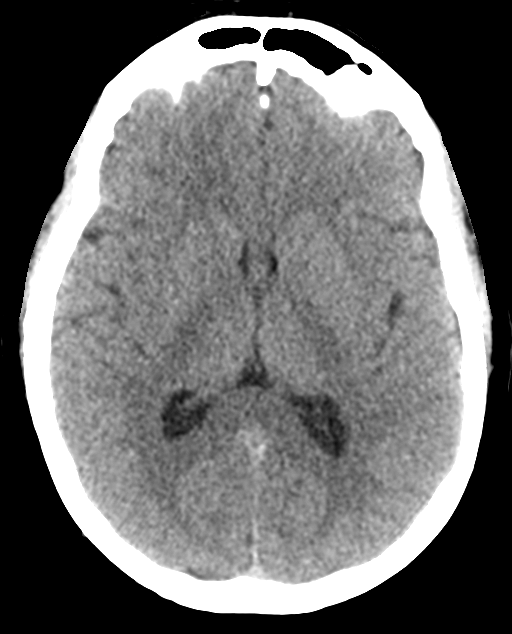
[im 21/33  brain]
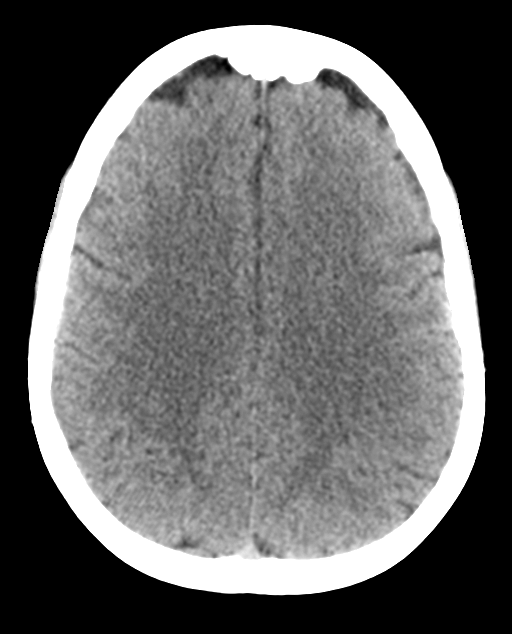
[im 21/33  bone]
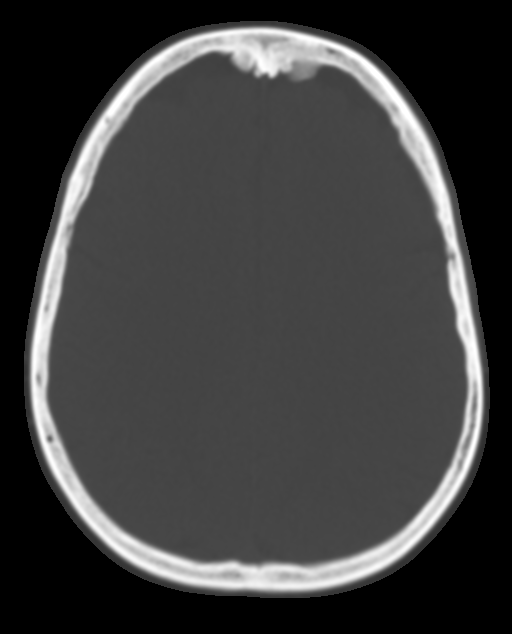
[im 25/33  brain]
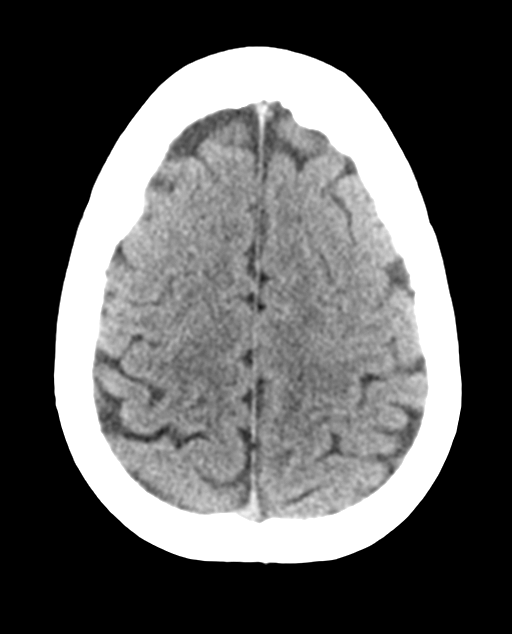
[im 29/33  brain]
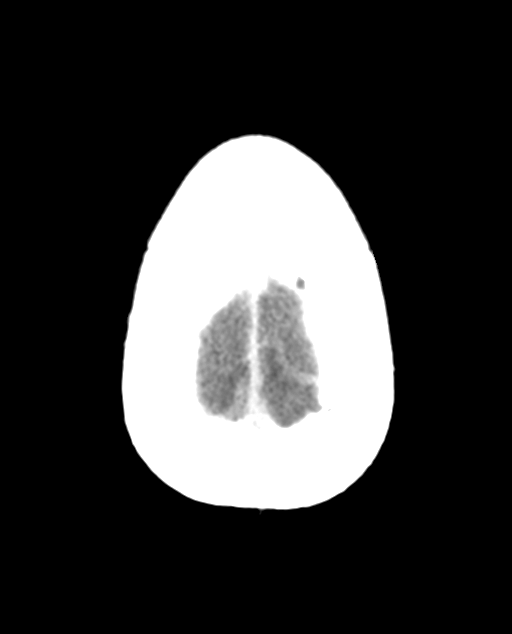

[Series 5: ax head bone · axial · 0.30mm/px · 1 of 81 slices shown]
[im 9/81  bone]
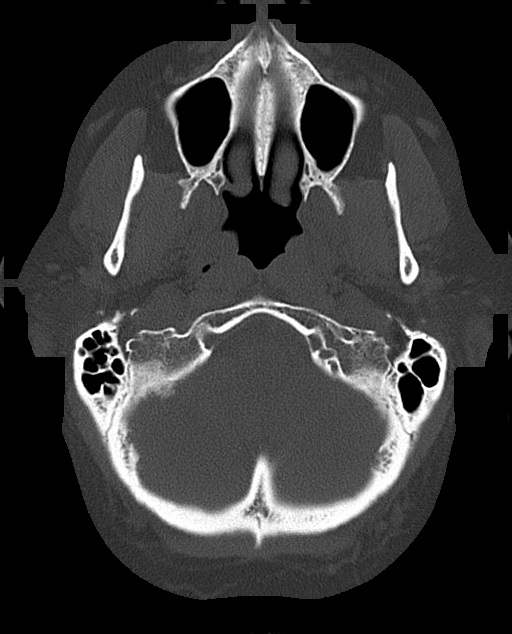

[Series 6: head without cor · coronal · non-contrast · 0.31mm/px · 3 of 67 slices shown]
[im 23/67  brain]
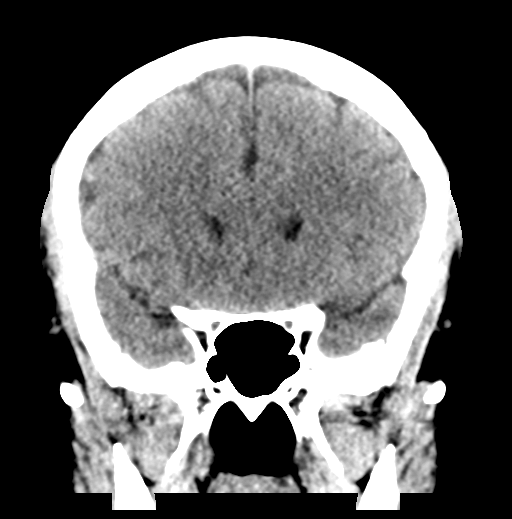
[im 30/67  brain]
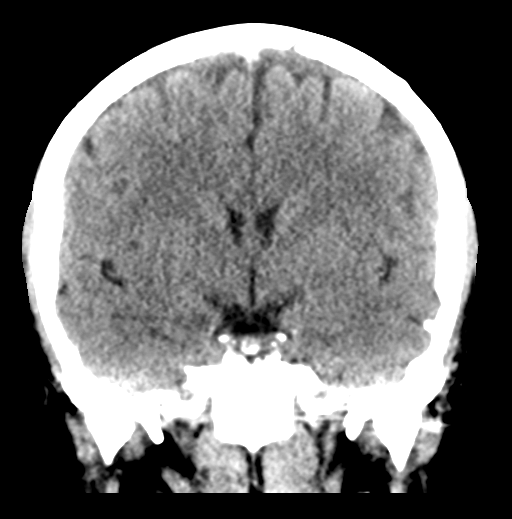
[im 37/67  brain]
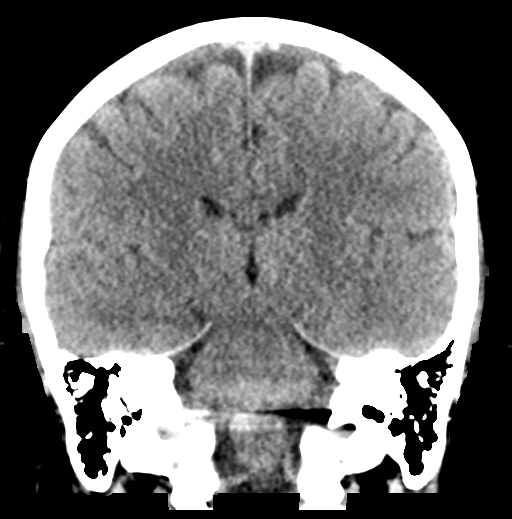

[Series 7: head without sag · sagittal · non-contrast · 0.31mm/px · 3 of 56 slices shown]
[im 19/56  brain]
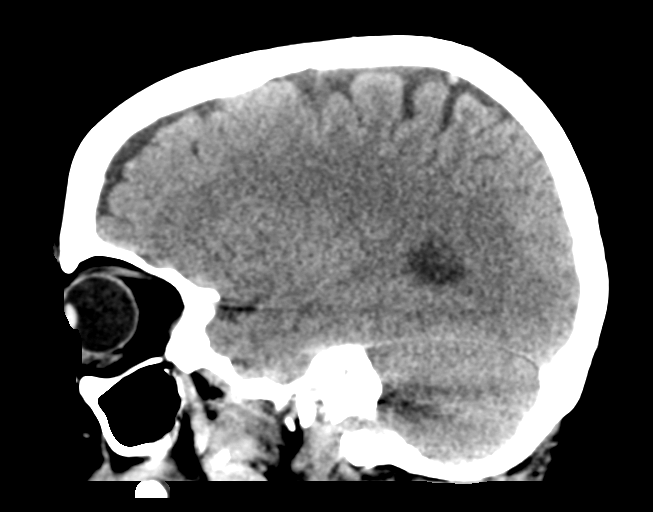
[im 28/56  brain]
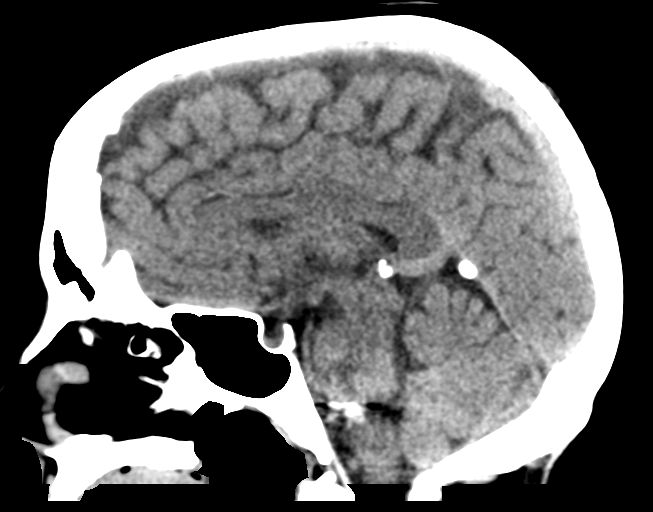
[im 37/56  brain]
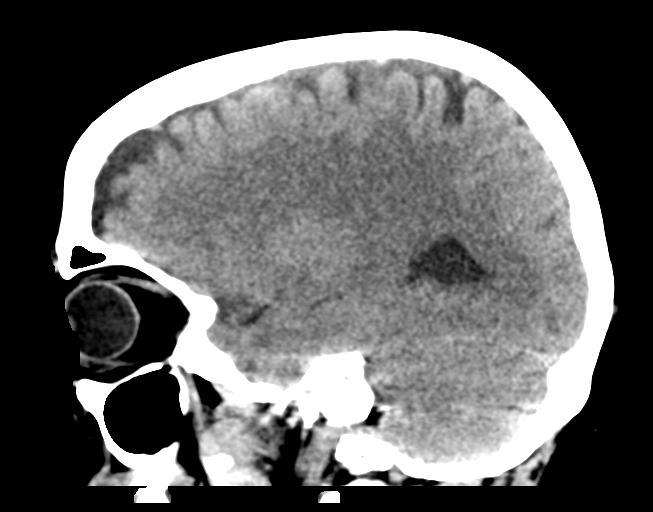

[14 of 47 positions shown; findings below may reference images not displayed]

FINDINGS: Brain: No evidence of parenchymal hemorrhage or extra-axial fluid
collection. No mass lesion, mass effect, or midline shift. No CT
evidence of acute infarction. Cerebral volume is age appropriate. No
ventriculomegaly.

Vascular: No acute abnormality.

Skull: No evidence of calvarial fracture.

Sinuses/Orbits: The visualized paranasal sinuses are essentially
clear.

Other:  The mastoid air cells are unopacified.
IMPRESSION: Negative noncontrast head CT. No evidence of acute intracranial
abnormality.
